# Patient Record
Sex: Female | Born: 1965
Health system: Southern US, Community
[De-identification: ages and names within clinical notes are randomized; demographics above are authoritative.]

## PROBLEM LIST (undated history)

## (undated) DIAGNOSIS — F419 Anxiety disorder, unspecified: Secondary | ICD-10-CM

## (undated) DIAGNOSIS — I10 Essential (primary) hypertension: Secondary | ICD-10-CM

## (undated) DIAGNOSIS — E669 Obesity, unspecified: Secondary | ICD-10-CM

## (undated) NOTE — *Deleted (*Deleted)
Patient reports SOB with dizziness/lighheaded this week , no cough or fever .

---

## 2010-06-15 ENCOUNTER — Emergency Department (HOSPITAL_COMMUNITY)
Admission: EM | Admit: 2010-06-15 | Discharge: 2010-06-16 | Disposition: A | Discharge status: Home / Self Care | Payer: Self-pay | Attending: Emergency Medicine | Admitting: Emergency Medicine

## 2010-06-15 DIAGNOSIS — F39 Unspecified mood [affective] disorder: Secondary | ICD-10-CM

## 2010-06-15 DIAGNOSIS — F3289 Other specified depressive episodes: Secondary | ICD-10-CM | POA: Insufficient documentation

## 2010-06-15 DIAGNOSIS — F430 Acute stress reaction: Secondary | ICD-10-CM | POA: Insufficient documentation

## 2010-06-15 DIAGNOSIS — F29 Unspecified psychosis not due to a substance or known physiological condition: Secondary | ICD-10-CM | POA: Insufficient documentation

## 2010-06-15 DIAGNOSIS — F329 Major depressive disorder, single episode, unspecified: Secondary | ICD-10-CM | POA: Insufficient documentation

## 2010-06-15 LAB — DIFFERENTIAL
Basophils Absolute: 0 10*3/uL (ref 0.0–0.1)
Basophils Relative: 0 % (ref 0–1)
Eosinophils Absolute: 0.1 10*3/uL (ref 0.0–0.7)
Eosinophils Relative: 1 % (ref 0–5)
Lymphocytes Relative: 27 % (ref 12–46)
Lymphs Abs: 2 10*3/uL (ref 0.7–4.0)
Monocytes Absolute: 0.4 10*3/uL (ref 0.1–1.0)
Monocytes Relative: 5 % (ref 3–12)
Neutro Abs: 5 10*3/uL (ref 1.7–7.7)
Neutrophils Relative %: 67 % (ref 43–77)

## 2010-06-15 LAB — URINALYSIS, ROUTINE W REFLEX MICROSCOPIC
Bilirubin Urine: NEGATIVE
Nitrite: NEGATIVE
Specific Gravity, Urine: 1.028 (ref 1.005–1.030)
Urobilinogen, UA: 1 mg/dL (ref 0.0–1.0)
pH: 6 (ref 5.0–8.0)

## 2010-06-15 LAB — URINE MICROSCOPIC-ADD ON

## 2010-06-15 LAB — COMPREHENSIVE METABOLIC PANEL
Albumin: 3.6 g/dL (ref 3.5–5.2)
Alkaline Phosphatase: 56 U/L (ref 39–117)
BUN: 7 mg/dL (ref 6–23)
Calcium: 9.1 mg/dL (ref 8.4–10.5)
Potassium: 3.9 mEq/L (ref 3.5–5.1)
Sodium: 143 mEq/L (ref 135–145)
Total Protein: 7.1 g/dL (ref 6.0–8.3)

## 2010-06-15 LAB — CBC
MCHC: 33.1 g/dL (ref 30.0–36.0)
Platelets: 277 10*3/uL (ref 150–400)
RDW: 13.4 % (ref 11.5–15.5)
WBC: 7.4 10*3/uL (ref 4.0–10.5)

## 2010-06-15 LAB — RAPID URINE DRUG SCREEN, HOSP PERFORMED
Amphetamines: NOT DETECTED
Barbiturates: NOT DETECTED
Benzodiazepines: NOT DETECTED
Cocaine: NOT DETECTED
Opiates: NOT DETECTED
Tetrahydrocannabinol: NOT DETECTED

## 2010-06-15 LAB — PREGNANCY, URINE: Preg Test, Ur: NEGATIVE

## 2010-06-16 ENCOUNTER — Emergency Department (HOSPITAL_COMMUNITY)
Admission: EM | Admit: 2010-06-16 | Discharge: 2010-06-16 | Disposition: A | Discharge status: Home / Self Care | Payer: Self-pay | Attending: Emergency Medicine | Admitting: Emergency Medicine

## 2010-06-16 DIAGNOSIS — F430 Acute stress reaction: Secondary | ICD-10-CM | POA: Insufficient documentation

## 2013-05-15 DIAGNOSIS — I10 Essential (primary) hypertension: Secondary | ICD-10-CM

## 2013-05-15 HISTORY — DX: Essential (primary) hypertension: I10

## 2013-05-30 ENCOUNTER — Emergency Department (HOSPITAL_COMMUNITY)
Admission: EM | Admit: 2013-05-30 | Discharge: 2013-05-30 | Disposition: A | Discharge status: Home / Self Care | Payer: Self-pay | Attending: Emergency Medicine | Admitting: Emergency Medicine

## 2013-05-30 ENCOUNTER — Encounter (HOSPITAL_COMMUNITY): Payer: Self-pay | Admitting: Emergency Medicine

## 2013-05-30 DIAGNOSIS — F4389 Other reactions to severe stress: Secondary | ICD-10-CM | POA: Insufficient documentation

## 2013-05-30 DIAGNOSIS — Z5900 Homelessness unspecified: Secondary | ICD-10-CM | POA: Insufficient documentation

## 2013-05-30 DIAGNOSIS — F438 Other reactions to severe stress: Secondary | ICD-10-CM | POA: Insufficient documentation

## 2013-05-30 DIAGNOSIS — Z659 Problem related to unspecified psychosocial circumstances: Secondary | ICD-10-CM

## 2013-05-30 HISTORY — DX: Anxiety disorder, unspecified: F41.9

## 2013-05-30 NOTE — ED Notes (Signed)
Pt reports she has had lots of family stress recently. She said shes been having severe anxiety attacks. Her sister told her she needs to come to ER to be checked out before she can come back to live with her. Denies any physical complaints now. No SI or HI. Denies any street drug or alcohol use

## 2013-05-30 NOTE — ED Notes (Signed)
Given sandwich and beverage. Waiting for Social Work to arrange transportation to [facility].

## 2013-05-30 NOTE — ED Provider Notes (Signed)
Medical screening examination/treatment/procedure(s) were performed by non-physician practitioner and as supervising physician I was immediately available for consultation/collaboration.  EKG Interpretation   None         Ephraim Hamburger, MD 05/30/13 2231

## 2013-05-30 NOTE — ED Provider Notes (Signed)
CSN: [id number]     Arrival date & time 05/30/13  1009 History  This chart was scribed for non-physician practitioner, Clayton Bibles, PA-C, working with Ephraim Hamburger, MD by Roe Coombs, ED Scribe. This patient was seen in room TR08C/TR08C and the patient's care was started at 11:00 AM.     Chief Complaint  Patient presents with  . Anxiety   The history is provided by the patient. No language interpreter was used.    HPI Comments: Emma Shepherd is a 48 y.o. female who presents to the Emergency Department complaining of multiple anxiety attacks over the past several days. Patient has been living with her sister since 04/06/13. She has been fighting a lot with her sister and has been having increased stress. She states that her sister is very confrontational and has called the police about her several times. Patient reports that she tries to avoid confrontation with her sister, but she feels that her sister exacerbates situation. She further reports that her sister wanted her to come to the ED for evaluation before she can return back home. She says that her sister kicked her out yesterday afternoon and she actually stayed in the waiting room of the ED last night given the extremely cold temperatures outside and nowhere for her to stay. Patient feels that her anxiety is high because she does not see a resolution for her problems with her sister. She tried to call her sister earlier today to try to resolve their interpersonal issue, but her sister was not amenable. She denies depression. She denies HI and SI. She denies visual or auditory hallucinations. She denies fever, shortness of breath, leg swelling, cough, chest pain, abdominal pain, nausea, vomiting. She takes Aleve as needed for neck and back pain. Patient does not smoke.   Past Medical History  Diagnosis Date  . Anxiety    History reviewed. No pertinent past surgical history. History reviewed. No pertinent family history. History   Substance Use Topics  . Smoking status: Never Smoker   . Smokeless tobacco: Not on file  . Alcohol Use: Yes     Comment: social   OB History   Grav Para Term Preterm Abortions TAB SAB Ect Mult Living                 Review of Systems  Constitutional: Negative for fever.  Respiratory: Negative for cough and shortness of breath.   Cardiovascular: Negative for chest pain and leg swelling.  Gastrointestinal: Negative for nausea, vomiting and abdominal pain.  Psychiatric/Behavioral: Negative for suicidal ideas and hallucinations. The patient is nervous/anxious.     Allergies  Review of patient's allergies indicates no known allergies.  Home Medications  No current outpatient prescriptions on file. Triage Vitals: BP 149/115  Pulse 72  Temp(Src) 97.9 F (36.6 C) (Oral)  Resp 18  Wt 259 lb 4 oz (117.595 kg)  SpO2 99% Physical Exam  Nursing note and vitals reviewed. Constitutional: She appears well-developed and well-nourished. No distress.  HENT:  Head: Normocephalic and atraumatic.  Neck: Neck supple.  Cardiovascular: Normal rate and regular rhythm.   Pulmonary/Chest: Effort normal and breath sounds normal. No respiratory distress. She has no wheezes. She has no rales.  Abdominal: Soft. She exhibits no distension. There is no tenderness. There is no rebound and no guarding.  Neurological: She is alert.  Skin: She is not diaphoretic.  Psychiatric: She has a normal mood and affect. Her behavior is normal. Judgment and thought content normal.  ED Course  Procedures (including critical care time) DIAGNOSTIC STUDIES: Oxygen Saturation is 99% on room air, normal by my interpretation.    COORDINATION OF CARE: 11:07 AM- Patient informed of current plan for treatment and evaluation and agrees with plan at this time.     Labs Review Labs Reviewed - No data to display Imaging Review No results found.  EKG Interpretation   None      11:25 AM I spoke with social  worker who will come to see the patient.   MDM   1. Other social stressor     Patient presenting with family and interpersonal issues and nowhere to stay currently. Patient does admit to anxiety but notes that is a situational. She has not had any physical symptoms and denies depression, suicidal ideation, homicidal ideation. I do not believe she needs psychiatric help at this time or medical clearance for inpatient psychiatric care. Pt also believes she has no medical needs at this time. Erasmo Downer, Education officer, museum, spoke with patient and gave her resources.  Patient found a place to stay at a homeless shelter and was given a cab voucher to get there.  D/C.  Resources given.    I personally performed the services described in this documentation, which was scribed in my presence. The recorded information has been reviewed and is accurate.   Clayton Bibles, PA-C 05/30/13 1355

## 2013-05-30 NOTE — ED Notes (Signed)
Pt states she lives with her sister who called the police to pt removed from the house. They have apparently had a very bad relationship. States her sister says she is "disrupting the household". Sister wants pt to bring home a note to say she is "cleared" from a psychiatric standpoint.

## 2013-05-30 NOTE — Progress Notes (Signed)
CSW received referral for housing and mental health resources. CSW met with patient and explained role, patient agreeable to speak. Patient is currently homeless and has been having relationship issues with her sister Wonda Olds and mother. Patient's sister recently kicked patient out of the house, and patient was recently asked to leave [employer]. Patient discussed her relationship issues with her sister. CSW provided patient with emotional support and validated her concerns. CSW provided patient with resource packet including information on emergency housing, food banks, and mental health treatment. Patient requested that CSW contact sister Wonda Olds on her behalf. CSW was agreeable and contacted sister. Sister Wonda Olds provided CSW information regarding concerns for patient's "unrational" behavior and mental health stability. CSW validated sister's concern and attempts to assist patient in the past.   CSW informed patient that she could not return home with her sister at this time. Patient appeared disappointed, but shared that [employer] in [place] had availability. CSW contacted [facility] to get approval for cab voucher, as shelter staff could not pick patient up from bus stop.  Cab voucher provided, [employer] called at 1:45 pm. Patient thanked CSW for assistance.   CSW signing off. Please re-consult if further social work needs arise.  Tilden Fossa, MSW, [place] Clinical Social Worker [facility] Emergency Dept. [phone]

## 2013-05-30 NOTE — ED Notes (Signed)
Social worker paged  

## 2013-05-30 NOTE — Discharge Instructions (Signed)
Read the information below.  You may return to the Emergency Department at any time for worsening condition or any new symptoms that concern you.         Emergency Department Resource Guide 1) Find a Doctor and Pay Out of Pocket Although you won't have to find out who is covered by your insurance plan, it is a good idea to ask around and get recommendations. You will then need to call the office and see if the doctor you have chosen will accept you as a new patient and what types of options they offer for patients who are self-pay. Some doctors offer discounts or will set up payment plans for their patients who do not have insurance, but you will need to ask so you aren't surprised when you get to your appointment.  2) Contact Your Local Health Department Not all health departments have doctors that can see patients for sick visits, but many do, so it is worth a call to see if yours does. If you don't know where your local health department is, you can check in your phone book. The CDC also has a tool to help you locate your state's health department, and many state websites also have listings of all of their local health departments.  3) Find a [facility] If your illness is not likely to be very severe or complicated, you may want to try a walk in clinic. These are popping up all over the country in pharmacies, drugstores, and shopping centers. They're usually staffed by nurse practitioners or physician assistants that have been trained to treat common illnesses and complaints. They're usually fairly quick and inexpensive. However, if you have serious medical issues or chronic medical problems, these are probably not your best option.  No Primary Care Doctor: - Call Health Connect at  [phone] - they can help you locate a primary care doctor that  accepts your insurance, provides certain services, etc. - Physician Referral Service- [phone]  Chronic Pain Problems: Organization          Address  Phone   Notes  [facility]  [phone] Patients need to be referred by their primary care doctor.   Medication Assistance: Organization         Address  Phone   Notes  [facility] Medication [facility] [address] [place], [place] [address] [phone] --Must be a resident of [facility] -- Must have NO insurance coverage whatsoever (no Medicaid/ Medicare, etc.) -- The pt. MUST have a primary care doctor that directs their care regularly and follows them in the community   MedAssist  [phone]   [employer]  9294450034    Agencies that provide inexpensive medical care: Organization         Address  Phone   Notes  [place]  [phone]   [facility]    [phone]   [facility] [place],  [phone]   [place] [address], [place] [phone]   Planned Parenthood    [phone]   [facility]    [phone]   [place] and [place] [address], [place] Phone:  [phone], Fax:  [phone] Hours of Operation:  9 am - 6 pm, M-F.  Also accepts Medicaid/Medicare and self-pay.  [facility]  [address], [place] Phone: [phone], Fax: [phone]. Hours of Operation:  8:30 am - 5:30 pm, M-F.  Also accepts Medicaid and self-pay.  [facility] [address], [place] Phone: [phone]   [place], [place], [place] [phone], Ext. [phone] Mondays & Thursdays: 7-9 AM.  First 15 patients are seen on a first come, first serve basis.    [place] Providers:  Organization         Address  Phone   Notes  [facility] [address], [place] [phone] Also accepts self-pay patients.  [facility] [id number] [place], [place]  [phone]   [place], [address], [place] [phone]   [facility] Family Medicine [address] [place] [phone]   Lucianne Lei [address], [place]   [phone] Only accepts [place] Access [place] patients after they have their name applied to their card.   Self-Pay (no insurance) in [facility]:  Organization         Address  Phone   Notes  Sickle Cell Patients, [facility] Internal Medicine [place] [phone]   [facility] Urgent Care [place] [phone]   [facility] [place]  [place], [place], [place] [phone]   [facility]/Dr. Osei-Bonsu  [address] [place] or [address], [place] [phone] Phone number for both [place] Strite and [place] locations is the same.  [facility] [address] [place] [phone]   [facility] [address], [place] or [address] [phone]   [facility] [address], [place] [phone], phone; [phone], fax Sees patients 1st and 3rd Saturday of every month.  Must not qualify for public or private insurance (i.e. Medicaid, Medicare, [place] Health Choice, Veterans' Benefits)  Household income should be no more than 200% of the poverty level The clinic cannot treat you if you are pregnant or think you are pregnant  Sexually transmitted diseases are not treated at the clinic.    Dental Care: Organization         Address  Phone  Notes  [facility] Department of [facility] [place] [phone] Accepts children up to age 34 who are enrolled in [place] or [place]; pregnant women with a Medicaid card; and  children who have applied for Medicaid or [place] Health Choice, but were declined, whose parents can pay a reduced fee at time of service.  [facility] Department of [facility]  [address], [place] [phone] Accepts children up to age 36 who are enrolled in [place] or [place]; pregnant women with a Medicaid card; and children who have applied for Medicaid or [place] Health Choice, but were declined, whose parents can pay a reduced fee at time of service.  [place] Adult Dental Access PROGRAM  [place] [phone] Patients are seen by appointment only. Walk-ins are not accepted. [place] will see patients 57 years of age and older. Monday - Tuesday (8am-5pm) Most Wednesdays (8:30-5pm) $30 per visit, cash only  [place]  Access PROGRAM  [address], [facility] [phone] Patients are seen by appointment only. Walk-ins are not accepted. [place] will see patients 55 years of age and older. One Wednesday Evening (Monthly: Volunteer Based).  $30 per visit, cash only  [place]  [phone] for adults; Children under age 50, call Graduate Pediatric Dentistry at [phone]. Children aged 59-14, please call [phone] to request a pediatric application.  Dental services are provided in all areas of dental care including fillings, crowns and bridges, complete and partial dentures, implants, gum treatment, root canals, and extractions. Preventive care is also provided. Treatment is provided to both adults and children. Patients are selected via a lottery and there is often a waiting list.   [facility] [address] [place]  [phone] [email or web address]   [facility] [address] [place], [place] [phone], Ext. [phone] Second and Fourth Thursday of each month, opens at 6:30 AM; Clinic ends at 9 AM.  Patients are seen on a first-come first-served  basis, and a limited number are seen during each clinic.   [facility]  [address] Hillard Danker [place], [place] [phone]   Eligibility Requirements You must have lived in [place], [place], or [place] counties for at least the last three months.   You cannot be eligible for state or federal sponsored [employer], including [employer], [place], or [employer].   You generally cannot be eligible for healthcare insurance through your employer.    How to apply: Eligibility screenings are held every Tuesday and Wednesday afternoon from 1:00 pm until 4:00 pm. You do not need an appointment for the interview!  [facility] [address], [place], [place]   [place] Department  [place]  [phone]    Behavioral Health Resources in the Community: Intensive Outpatient Programs Organization         Address  Phone  Notes  Sully Kearns. [address], [place], [place] [phone]   [facility] Outpatient [address], [place], [place]   ADS: Alcohol & Drug Svcs [address] [place], [place]   [place] [address],  [place], [place] or [phone]   Substance Abuse Resources Organization         Address  Phone  Notes  Alcohol and Drug Services  [phone]   [place]  [phone]   [facility] [place]   Chinita Pester  [phone]   Residential & Outpatient Substance Abuse Program  [phone]   Psychological Services Organization         Address  Phone  Notes  [facility] [place]  [place]  [phone]   [place] [address], [place] [phone] or [phone]    Mobile Crisis Teams Organization          Address  Phone  Notes  Therapeutic Alternatives, Mobile Crisis Care Unit  [phone]   Assertive Psychotherapeutic Services  [address] [place], [place]   [place] Levels [address] [place] [phone]    Self-Help/Support Groups Organization         Address  Phone             Notes  Mental Health  [place] - variety of support groups  336- H3156881 Call for more information  Narcotics Anonymous (NA), Caring Services [address], [employer] [place]  2 meetings at this location   Special educational needs teacher         Address  Phone  Notes  ASAP Residential Treatment [place],    [place]  [phone]   [facility]  [address], [place] [id number], [place], [place]   [place], [place] [phone] Admissions: 8am-3pm M-F  Incentives Substance [place] [address],    [place], [place] [phone]   [facility] [address]  [place], [place], [place]   [facility] [address],  [place], [place]   Insight Programs - Intensive Outpatient [address] Krist[address], [place], [place]   [facility] ([place].) [place].,  [place], [place] [phone]) [phone] or [phone]   Residential Treatment Services (RTS) [address], [place], [place] Accepts Medicaid  Fellowship [place] [address]  [place] [phone] Substance Abuse/Addiction Treatment   [facility] Organization         Address  Phone  Notes  [facility]  [phone]   Domenic Schwab, PhD [address] Arlis Porta [place], [place]   [phone] or [phone]   [place], [place] [phone]   [facility] [address], [place], [place] [phone] Insurance/Medicaid/sponsorship  through [facility] and Families [address] [place]                                    [place], [place] [phone] [place] [address], [place] [phone]    Dr. Adele Schilder  [phone]   [facility] Dept. 1) [address], [place] 2) [place] 3)  [place] [address], [place] [phone]  [phone]   [place] [phone] or [phone] (After Hours)      [place] in the  [facility]  Intensive Outpatient Programs: [facility]      Silvana. [place], [place] Both a day and evening program       [facility] Outpatient     [address]        [place], [place] [address] [phone]         ADS: Alcohol & Drug Svcs [place]: ([phone])[phone] or 8313505232 [address] [place], [place] [address] [email or web address]  Mobile Crisis Teams:                                        Therapeutic Alternatives         Mobile Crisis Care Unit [phone]             Assertive Psychotherapeutic Services [address] Lady [place] [phone]  [place] [address] [place] [phone]  Self-Help/Support Groups: Mental Health Assoc. of [employer] of support groups ([phone])[phone] (call for more info)  Narcotics Anonymous (NA) Caring Services [address] [place] - 2 meetings at this location  Residential Treatment Programs:  [place]       [place] [address] [place], [place]   [address] [place]  [address] [place], [place] [address] [phone] Admissions: 8am-3pm M-F  Incentives Substance [place]     [address] [place], [place] [address]       [phone]         [facility] [address] Jadene Pierini [place], [place]  [facility] [address] [place], [place]  Insight Programs - Intensive Outpatient      [address] [place] [phone]     [place], [place]         [facility] ([place].)     [place], [place] or [phone]  Residential Treatment Services (RTS)  [place], [place]  Fellowship [address]                                               [place]  [facility] Resources: [phone]               General Therapy                                                Domenic Schwab, PhD        [address] [place], [place] [address]         [facility]   [address] [place], [place] [address] [phone]  [facility] Recovery [address] [place], [place] [address] [phone] Insurance/Medicaid/sponsorship through [facility] and Families                                              [place]                                        [place], [place] [address]    Therapy/tele-psych/case         [place]  [place], [place]  [address]  Adolescent/group home/case management  [place] PhD       General therapy       Insurance   [phone]         Dr. Adele Schilder Insurance [phone] M-F  [place] [facility]ox/Residential Medicaid, sponsorship [phone]

## 2013-07-22 ENCOUNTER — Encounter (HOSPITAL_COMMUNITY): Payer: Self-pay | Admitting: Emergency Medicine

## 2013-07-22 ENCOUNTER — Emergency Department (HOSPITAL_COMMUNITY)
Admission: EM | Admit: 2013-07-22 | Discharge: 2013-07-22 | Disposition: A | Discharge status: Home / Self Care | Payer: Self-pay | Attending: Emergency Medicine | Admitting: Emergency Medicine

## 2013-07-22 DIAGNOSIS — Z3202 Encounter for pregnancy test, result negative: Secondary | ICD-10-CM | POA: Insufficient documentation

## 2013-07-22 DIAGNOSIS — Z8659 Personal history of other mental and behavioral disorders: Secondary | ICD-10-CM | POA: Insufficient documentation

## 2013-07-22 DIAGNOSIS — N760 Acute vaginitis: Secondary | ICD-10-CM | POA: Insufficient documentation

## 2013-07-22 DIAGNOSIS — A499 Bacterial infection, unspecified: Secondary | ICD-10-CM | POA: Insufficient documentation

## 2013-07-22 DIAGNOSIS — B9689 Other specified bacterial agents as the cause of diseases classified elsewhere: Secondary | ICD-10-CM | POA: Insufficient documentation

## 2013-07-22 LAB — COMPREHENSIVE METABOLIC PANEL
ALT: 12 U/L (ref 0–35)
AST: 15 U/L (ref 0–37)
Albumin: 3.6 g/dL (ref 3.5–5.2)
Alkaline Phosphatase: 69 U/L (ref 39–117)
BUN: 10 mg/dL (ref 6–23)
CALCIUM: 9.4 mg/dL (ref 8.4–10.5)
CO2: 29 mEq/L (ref 19–32)
Chloride: 101 mEq/L (ref 96–112)
Creatinine, Ser: 0.75 mg/dL (ref 0.50–1.10)
GFR calc non Af Amer: >90 mL/min (ref >90)
GLUCOSE: 84 mg/dL (ref 70–99)
Potassium: 4 mEq/L (ref 3.7–5.3)
SODIUM: 140 meq/L (ref 137–147)
TOTAL PROTEIN: 8 g/dL (ref 6.0–8.3)
Total Bilirubin: 0.4 mg/dL (ref 0.3–1.2)

## 2013-07-22 LAB — URINALYSIS, ROUTINE W REFLEX MICROSCOPIC
BILIRUBIN URINE: NEGATIVE
Glucose, UA: NEGATIVE mg/dL
Hgb urine dipstick: NEGATIVE
Ketones, ur: NEGATIVE mg/dL
Leukocytes, UA: NEGATIVE
NITRITE: NEGATIVE
PH: 7.5 (ref 5.0–8.0)
Protein, ur: NEGATIVE mg/dL
SPECIFIC GRAVITY, URINE: 1.022 (ref 1.005–1.030)
Urobilinogen, UA: 0.2 mg/dL (ref 0.0–1.0)

## 2013-07-22 LAB — CBC WITH DIFFERENTIAL/PLATELET
BASOS ABS: 0 10*3/uL (ref 0.0–0.1)
Basophils Relative: 0 % (ref 0–1)
EOS ABS: 0.1 10*3/uL (ref 0.0–0.7)
EOS PCT: 2 % (ref 0–5)
HCT: 42.8 % (ref 36.0–46.0)
Hemoglobin: 14.5 g/dL (ref 12.0–15.0)
Lymphocytes Relative: 25 % (ref 12–46)
Lymphs Abs: 2.2 10*3/uL (ref 0.7–4.0)
MCH: 28.5 pg (ref 26.0–34.0)
MCHC: 33.9 g/dL (ref 30.0–36.0)
MCV: 84.3 fL (ref 78.0–100.0)
Monocytes Absolute: 0.4 10*3/uL (ref 0.1–1.0)
Monocytes Relative: 4 % (ref 3–12)
Neutro Abs: 6 10*3/uL (ref 1.7–7.7)
Neutrophils Relative %: 69 % (ref 43–77)
PLATELETS: 288 10*3/uL (ref 150–400)
RBC: 5.08 MIL/uL (ref 3.87–5.11)
RDW: 14.3 % (ref 11.5–15.5)
WBC: 8.7 10*3/uL (ref 4.0–10.5)

## 2013-07-22 LAB — WET PREP, GENITAL
TRICH WET PREP: NONE SEEN
YEAST WET PREP: NONE SEEN

## 2013-07-22 LAB — POC URINE PREG, ED: PREG TEST UR: NEGATIVE

## 2013-07-22 MED ORDER — METRONIDAZOLE 500 MG PO TABS: 500.0000 mg | ORAL_TABLET | Freq: Two times a day (BID) | ORAL | Status: DC

## 2013-07-22 NOTE — ED Notes (Signed)
Pt reports RLQ that has been intermittent about the last month. Pt reports last night the pain became more intense. Pt denies nausea, vomiting. Reports recent diarrhea.

## 2013-07-22 NOTE — ED Provider Notes (Signed)
CSN: [id number]     Arrival date & time 07/22/13  1143 History   First MD Initiated Contact with Patient 07/22/13 1505     Chief Complaint  Patient presents with  . Abdominal Pain     (Consider location/radiation/quality/duration/timing/severity/associated sxs/prior Treatment) HPI Comments: Patient is a 48 year old female with history of anxiety who presents today with 1 month of intermittent abdominal pain. She reports that the pain is only present at night and it is more of a pulling sensation. She reports it was the same sensation she had when she was pregnant. The pain seems to have worsened over the past 5 days, but is consistently only at night. Certain positions make the pain worse. She has not taken any medication for the pain because it does not bother her during the day. She has had a new boyfriend for the past 3 months and reports an increase in sexual intercourse. She denies severe dyspareunia, stating "if there is any pain we will change position and it's ok". She denies fevers, chills, nausea, vomiting, diarrhea, vaginal discharge, abnormal vaginal bleeding, appetite change.   Patient is a 48 y.o. female presenting with abdominal pain. The history is provided by the patient. No language interpreter was used.  Abdominal Pain Associated symptoms: no chest pain, no chills, no diarrhea, no dysuria, no fever, no hematuria, no nausea, no shortness of breath, no vaginal bleeding, no vaginal discharge and no vomiting     Past Medical History  Diagnosis Date  . Anxiety    History reviewed. No pertinent past surgical history. History reviewed. No pertinent family history. History  Substance Use Topics  . Smoking status: Never Smoker   . Smokeless tobacco: Not on file  . Alcohol Use: Yes     Comment: social   OB History   Grav Para Term Preterm Abortions TAB SAB Ect Mult Living                 Review of Systems  Constitutional: Negative for fever, chills and appetite change.   Respiratory: Negative for shortness of breath.   Cardiovascular: Negative for chest pain.  Gastrointestinal: Positive for abdominal pain. Negative for nausea, vomiting and diarrhea.  Genitourinary: Positive for pelvic pain. Negative for dysuria, urgency, hematuria, flank pain, vaginal bleeding, vaginal discharge and menstrual problem.  All other systems reviewed and are negative.      Allergies  Review of patient's allergies indicates no known allergies.  Home Medications  No current outpatient prescriptions on file. BP 145/62  Pulse 67  Temp(Src) 98.3 F (36.8 C) (Oral)  Resp 20  Wt 253 lb 4.8 oz (114.896 kg)  SpO2 100%  LMP 07/13/2013 Physical Exam  Nursing note and vitals reviewed. Constitutional: She is oriented to person, place, and time. She appears well-developed and well-nourished.  Non-toxic appearance. She does not have a sickly appearance. She does not appear ill. No distress.  Very well appearing. Patient appears very comfortable.   HENT:  Head: Normocephalic and atraumatic.  Right Ear: External ear normal.  Left Ear: External ear normal.  Nose: Nose normal.  Mouth/Throat: Oropharynx is clear and moist.  Eyes: Conjunctivae are normal.  Neck: Normal range of motion.  Cardiovascular: Normal rate, regular rhythm and normal heart sounds.   Pulmonary/Chest: Effort normal and breath sounds normal. No stridor. No respiratory distress. She has no wheezes. She has no rales.  Abdominal: Soft. Bowel sounds are normal. She exhibits no distension. There is no tenderness. There is no rigidity, no  rebound and no guarding.  Genitourinary: Vagina normal. There is no rash, tenderness, lesion or injury on the right labia. There is no rash, tenderness, lesion or injury on the left labia. Cervix exhibits no motion tenderness, no discharge and no friability. Right adnexum displays no mass, no tenderness and no fullness. Left adnexum displays no mass, no tenderness and no fullness. No  erythema, tenderness or bleeding around the vagina. No foreign body around the vagina. No signs of injury around the vagina. No vaginal discharge found.  No tenderness to exam. No cervical irritation seen.   Musculoskeletal: Normal range of motion.  Neurological: She is alert and oriented to person, place, and time. She has normal strength.  Skin: Skin is warm and dry. She is not diaphoretic. No erythema.  Psychiatric: She has a normal mood and affect. Her behavior is normal.    ED Course  Procedures (including critical care time) Labs Review Labs Reviewed  WET PREP, GENITAL - Abnormal; Notable for the following:    Clue Cells Wet Prep HPF POC FEW (*)    WBC, Wet Prep HPF POC FEW (*)    All other components within normal limits  GC/CHLAMYDIA PROBE AMP  CBC WITH DIFFERENTIAL  COMPREHENSIVE METABOLIC PANEL  URINALYSIS, ROUTINE W REFLEX MICROSCOPIC  RPR  POC URINE PREG, ED   Imaging Review No results found.   EKG Interpretation None      MDM   Final diagnoses:  Bacterial vaginosis   Patient presents to the ED with complaint of intermittent pulling in her RLQ at night for the past month. Patient is very well appearing. She is energetic and talkative. She has had a significant increase in sexual intercourse and is concerned this is the route of her problem. Her abdomen is soft and nontender throughout her ED stay. Pelvic exam shows no tenderness, specifically no CMT. No concern for PID, ovarian torsion, ectopic pregnancy, or appendicitis. Few clue cells were seen on wet prep. Will tx for BV with flagyl. Discussed that patient should avoid drinking alcohol while on flagyl. She was encouraged to follow up with PCP as well as a gynecologist. Discussed reasons to the return to the ED. Vital signs stable for discharge. Patient / Family / Caregiver informed of clinical course, understand medical decision-making process, and agree with plan.   Elwyn Lade, PA-C 07/23/13 1344

## 2013-07-22 NOTE — ED Notes (Signed)
Onset 1 month ago intermittant pain right of lower mid abd pain.  Pain is worse at night when pt lays down, unable to get comfortable.  Not much pain during day.  No vomiting, diarrhea, fevers, urinary symptoms.

## 2013-07-22 NOTE — Discharge Instructions (Signed)

## 2013-07-22 NOTE — ED Notes (Signed)
Called lab to check on wet prep results.  Per lab she will do test now. PA aware.

## 2013-07-23 LAB — GC/CHLAMYDIA PROBE AMP
CT PROBE, AMP APTIMA: NEGATIVE
GC Probe RNA: NEGATIVE

## 2013-07-23 LAB — RPR: RPR Ser Ql: NONREACTIVE

## 2013-07-26 NOTE — ED Provider Notes (Signed)
Medical screening examination/treatment/procedure(s) were performed by non-physician practitioner and as supervising physician I was immediately available for consultation/collaboration.    Kathalene Frames, MD 07/26/13 [id number]

## 2013-08-05 ENCOUNTER — Emergency Department (HOSPITAL_COMMUNITY)
Admission: EM | Admit: 2013-08-05 | Discharge: 2013-08-05 | Disposition: A | Discharge status: Home / Self Care | Payer: Self-pay | Attending: Emergency Medicine | Admitting: Emergency Medicine

## 2013-08-05 ENCOUNTER — Encounter (HOSPITAL_COMMUNITY): Payer: Self-pay | Admitting: Emergency Medicine

## 2013-08-05 DIAGNOSIS — N949 Unspecified condition associated with female genital organs and menstrual cycle: Secondary | ICD-10-CM | POA: Insufficient documentation

## 2013-08-05 DIAGNOSIS — R102 Pelvic and perineal pain: Secondary | ICD-10-CM

## 2013-08-05 DIAGNOSIS — Z3202 Encounter for pregnancy test, result negative: Secondary | ICD-10-CM | POA: Insufficient documentation

## 2013-08-05 DIAGNOSIS — Z79899 Other long term (current) drug therapy: Secondary | ICD-10-CM | POA: Insufficient documentation

## 2013-08-05 DIAGNOSIS — Z8659 Personal history of other mental and behavioral disorders: Secondary | ICD-10-CM | POA: Insufficient documentation

## 2013-08-05 LAB — URINALYSIS, ROUTINE W REFLEX MICROSCOPIC
Bilirubin Urine: NEGATIVE
GLUCOSE, UA: NEGATIVE mg/dL
Hgb urine dipstick: NEGATIVE
Ketones, ur: NEGATIVE mg/dL
LEUKOCYTES UA: NEGATIVE
Nitrite: NEGATIVE
PH: 6 (ref 5.0–8.0)
Protein, ur: NEGATIVE mg/dL
SPECIFIC GRAVITY, URINE: 1.025 (ref 1.005–1.030)
Urobilinogen, UA: 0.2 mg/dL (ref 0.0–1.0)

## 2013-08-05 LAB — POC URINE PREG, ED: Preg Test, Ur: NEGATIVE

## 2013-08-05 LAB — WET PREP, GENITAL
CLUE CELLS WET PREP: NONE SEEN
TRICH WET PREP: NONE SEEN
YEAST WET PREP: NONE SEEN

## 2013-08-05 NOTE — ED Provider Notes (Signed)
CSN: [id number]     Arrival date & time 08/05/13  2671 History   First MD Initiated Contact with Patient 08/05/13 [phone]     Chief Complaint  Patient presents with  . Pelvic Pain     (Consider location/radiation/quality/duration/timing/severity/associated sxs/prior Treatment) Patient is a 48 y.o. female presenting with pelvic pain.  Pelvic Pain The current episode started 1 to 4 weeks ago. Pertinent negatives include no chills, fever, nausea or vomiting. Associated symptoms comments: Vaginal pain and suprapubic pressure for the past one month. She was seen and evaluated on 07/22/13 in the ED for same, treated for BV with minimal improvement. She states that symptoms persist, no worse, prompting return to the ED. Marland Kitchen    Past Medical History  Diagnosis Date  . Anxiety    History reviewed. No pertinent past surgical history. History reviewed. No pertinent family history. History  Substance Use Topics  . Smoking status: Never Smoker   . Smokeless tobacco: Not on file  . Alcohol Use: Yes     Comment: social   OB History   Grav Para Term Preterm Abortions TAB SAB Ect Mult Living                 Review of Systems  Constitutional: Negative for fever and chills.  Gastrointestinal: Negative.  Negative for nausea and vomiting.       See HPI.  Genitourinary: Positive for vaginal pain and pelvic pain. Negative for dysuria and vaginal discharge.  Neurological: Negative.       Allergies  Review of patient's allergies indicates no known allergies.  Home Medications   Current Outpatient Rx  Name  Route  Sig  Dispense  Refill  . naproxen sodium (ANAPROX) 220 MG tablet   Oral   Take 440 mg by mouth daily as needed (pain).         . metroNIDAZOLE (FLAGYL) 500 MG tablet   Oral   Take 1 tablet (500 mg total) by mouth 2 (two) times daily. One po bid x 7 days   14 tablet   0    BP 154/79  Pulse 75  Temp(Src) 98.3 F (36.8 C) (Oral)  Resp 18  Wt 254 lb (115.214 kg)  SpO2 98%   LMP 07/13/2013 Physical Exam  Constitutional: She is oriented to person, place, and time. She appears well-developed and well-nourished.  HENT:  Head: Normocephalic.  Neck: Normal range of motion. Neck supple.  Cardiovascular: Normal rate and regular rhythm.   Pulmonary/Chest: Effort normal and breath sounds normal.  Abdominal: Soft. Bowel sounds are normal. There is no tenderness. There is no rebound and no guarding.  Genitourinary: Vagina normal and uterus normal. No vaginal discharge found.  No adnexal mass or tenderness. No blisters, redness or swelling of vulva.  Musculoskeletal: Normal range of motion.  Neurological: She is alert and oriented to person, place, and time.  Skin: Skin is warm and dry.  Psychiatric: She has a normal mood and affect.    ED Course  Procedures (including critical care time) Labs Review Labs Reviewed  WET PREP, GENITAL - Abnormal; Notable for the following:    WBC, Wet Prep HPF POC FEW (*)    All other components within normal limits  GC/CHLAMYDIA PROBE AMP  URINALYSIS, ROUTINE W REFLEX MICROSCOPIC  POC URINE PREG, ED   Results for orders placed during the hospital encounter of 08/05/13  WET PREP, GENITAL      Result Value Ref Range   Yeast Wet Prep HPF POC  NONE SEEN  NONE SEEN   Trich, Wet Prep NONE SEEN  NONE SEEN   Clue Cells Wet Prep HPF POC NONE SEEN  NONE SEEN   WBC, Wet Prep HPF POC FEW (*) NONE SEEN  URINALYSIS, ROUTINE W REFLEX MICROSCOPIC      Result Value Ref Range   Color, Urine YELLOW  YELLOW   APPearance CLEAR  CLEAR   Specific Gravity, Urine 1.025  1.005 - 1.030   pH 6.0  5.0 - 8.0   Glucose, UA NEGATIVE  NEGATIVE mg/dL   Hgb urine dipstick NEGATIVE  NEGATIVE   Bilirubin Urine NEGATIVE  NEGATIVE   Ketones, ur NEGATIVE  NEGATIVE mg/dL   Protein, ur NEGATIVE  NEGATIVE mg/dL   Urobilinogen, UA 0.2  0.0 - 1.0 mg/dL   Nitrite NEGATIVE  NEGATIVE   Leukocytes, UA NEGATIVE  NEGATIVE  POC URINE PREG, ED      Result Value Ref  Range   Preg Test, Ur NEGATIVE  NEGATIVE    Imaging Review No results found.   EKG Interpretation None      MDM   Final diagnoses:  None    1. Vaginal pain  No change to exam and lab findings. GC/chlamydia not repeated. Discussed condition with patient and need for GYN follow up. Referral made.   Dewaine Oats, PA-C 08/05/13 1058

## 2013-08-05 NOTE — ED Notes (Signed)
Pelvic cart to room

## 2013-08-05 NOTE — ED Notes (Signed)
Pelvic cart set-up at bedside; pt ready

## 2013-08-05 NOTE — ED Notes (Signed)
Pt reports being seen here two weeks ago for pelvic pressure, was diagnosed with bacterial infection and treated with antibiotics. Reports still having pelvic pain and now vaginal itching also.

## 2013-08-05 NOTE — ED Notes (Signed)
Pt dc to home. Pt sts understanding to dc instructions. Pt ambulatory to exit without difficulty.  Pt denies need for w/c.  

## 2013-08-05 NOTE — Discharge Instructions (Signed)
FOLLOW UP WITH GYNECOLOGY FOR FURTHER MANAGEMENT AND EVALUATION OF PERSISTENT VAGINAL PAIN AND ABDOMINAL PRESSURE.

## 2013-08-05 NOTE — ED Notes (Signed)
Chaperoned [place], PA with pelvic examination

## 2013-08-06 LAB — GC/CHLAMYDIA PROBE AMP
CT Probe RNA: NEGATIVE
GC Probe RNA: NEGATIVE

## 2013-08-06 NOTE — ED Provider Notes (Signed)
Medical screening examination/treatment/procedure(s) were performed by non-physician practitioner and as supervising physician I was immediately available for consultation/collaboration.   EKG Interpretation None       Nat Christen, MD 08/06/13 1219

## 2013-11-01 ENCOUNTER — Encounter (HOSPITAL_COMMUNITY): Payer: Self-pay | Admitting: Emergency Medicine

## 2013-11-01 ENCOUNTER — Emergency Department (HOSPITAL_COMMUNITY)
Admission: EM | Admit: 2013-11-01 | Discharge: 2013-11-01 | Discharge status: Against Medical Advice | Payer: Self-pay | Attending: Emergency Medicine | Admitting: Emergency Medicine

## 2013-11-01 DIAGNOSIS — R110 Nausea: Secondary | ICD-10-CM | POA: Insufficient documentation

## 2013-11-01 DIAGNOSIS — N898 Other specified noninflammatory disorders of vagina: Secondary | ICD-10-CM | POA: Insufficient documentation

## 2013-11-01 DIAGNOSIS — Z8659 Personal history of other mental and behavioral disorders: Secondary | ICD-10-CM | POA: Insufficient documentation

## 2013-11-01 DIAGNOSIS — R51 Headache: Secondary | ICD-10-CM | POA: Insufficient documentation

## 2013-11-01 DIAGNOSIS — R109 Unspecified abdominal pain: Secondary | ICD-10-CM | POA: Insufficient documentation

## 2013-11-01 DIAGNOSIS — R42 Dizziness and giddiness: Secondary | ICD-10-CM | POA: Insufficient documentation

## 2013-11-01 DIAGNOSIS — Z3202 Encounter for pregnancy test, result negative: Secondary | ICD-10-CM | POA: Insufficient documentation

## 2013-11-01 DIAGNOSIS — N949 Unspecified condition associated with female genital organs and menstrual cycle: Secondary | ICD-10-CM | POA: Insufficient documentation

## 2013-11-01 DIAGNOSIS — R197 Diarrhea, unspecified: Secondary | ICD-10-CM | POA: Insufficient documentation

## 2013-11-01 DIAGNOSIS — Z792 Long term (current) use of antibiotics: Secondary | ICD-10-CM | POA: Insufficient documentation

## 2013-11-01 LAB — URINALYSIS, ROUTINE W REFLEX MICROSCOPIC
Bilirubin Urine: NEGATIVE
GLUCOSE, UA: NEGATIVE mg/dL
KETONES UR: NEGATIVE mg/dL
Nitrite: NEGATIVE
PH: 7 (ref 5.0–8.0)
PROTEIN: NEGATIVE mg/dL
Specific Gravity, Urine: 1.025 (ref 1.005–1.030)
Urobilinogen, UA: 1 mg/dL (ref 0.0–1.0)

## 2013-11-01 LAB — URINE MICROSCOPIC-ADD ON

## 2013-11-01 LAB — PREGNANCY, URINE: Preg Test, Ur: NEGATIVE

## 2013-11-01 MED ORDER — SODIUM CHLORIDE 0.9 % IV BOLUS (SEPSIS): 1000.0000 mL | Freq: Once | INTRAVENOUS | Status: DC

## 2013-11-01 NOTE — ED Notes (Signed)
Pt. Having lower abdominal pain,  Began this am after having sex.  After sex, she also started having her menses only when she goes to the bathroom.  Breast tenderness, loose stool in the mornings for the last 4 weeks.  She thinks she might be pregnant. Pt. Reports that her last full menses was March.  3rd.    She is having abdominal cramping.  Pt. Denies  N/v.  She also is experiencing dizziness when crossing bridges and also crossing the road.

## 2013-11-01 NOTE — ED Notes (Signed)
Pt left AMA °

## 2013-11-01 NOTE — ED Provider Notes (Signed)
Medical screening examination/treatment/procedure(s) were performed by non-physician practitioner and as supervising physician I was immediately available for consultation/collaboration.   EKG Interpretation None        Ezequiel Essex, MD 11/01/13 2010

## 2013-11-01 NOTE — ED Provider Notes (Signed)
CSN: [id number]     Arrival date & time 11/01/13  1444 History   First MD Initiated Contact with Patient 11/01/13 1519     Chief Complaint  Patient presents with  . Abdominal Pain   HPI  Emma Shepherd is a 48 y.o. female with a PMH of anxiety who presents to the ED for evaluation of abdominal pain. History was provided by the patient. Patient states she developed lower abdominal pain this morning after intercourse. She describes a "discomfort" in her lower abdomen throughout with no focal pain. Also had mild vaginal spotting which started after intercourse. No hemorrhage or lacerations. Her pain is intermittent. Nothing makes it better or worse. She states she has had this before but cannot remember the cause. She believes she is pregnant and has not taken a home pregnancy test. She has not had a full menstrual cycle since March. She has had intermittent spotting for a day or two since March, but not "like my regular period." Does not use contraception. Patient has also had breast tenderness and intermittent nausea. No dysuria, vaginal discharge, emesis. Has had loose stools for the past month. No hematochezia. Patient also complains of lightheadedness and dizziness when crossing bridges and the road, but denies these symptoms currently. Has a mild generalized headache on ROS. No fever, chills, weakness, chest pain, SOB, or other concerns.    Past Medical History  Diagnosis Date  . Anxiety    History reviewed. No pertinent past surgical history. No family history on file. History  Substance Use Topics  . Smoking status: Never Smoker   . Smokeless tobacco: Not on file  . Alcohol Use: Yes     Comment: social   OB History   Grav Para Term Preterm Abortions TAB SAB Ect Mult Living                 Review of Systems  Constitutional: Negative for fever, chills, activity change, appetite change and fatigue.  HENT: Negative for congestion, rhinorrhea and sore throat.   Respiratory: Negative  for cough and shortness of breath.   Cardiovascular: Negative for chest pain and leg swelling.  Gastrointestinal: Positive for nausea, abdominal pain and diarrhea. Negative for vomiting, constipation and blood in stool.  Genitourinary: Positive for vaginal bleeding, vaginal pain and menstrual problem. Negative for dysuria, urgency, frequency, hematuria, flank pain, decreased urine volume, vaginal discharge, difficulty urinating, genital sores, pelvic pain and dyspareunia.  Musculoskeletal: Negative for back pain and myalgias.  Skin: Negative for wound.  Neurological: Positive for dizziness, light-headedness and headaches. Negative for weakness.    Allergies  Review of patient's allergies indicates no known allergies.  Home Medications   Prior to Admission medications   Medication Sig Start Date End Date Taking? Authorizing Provider  metroNIDAZOLE (FLAGYL) 500 MG tablet Take 1 tablet (500 mg total) by mouth 2 (two) times daily. One po bid x 7 days 07/22/13   Elwyn Lade, PA-C  naproxen sodium (ANAPROX) 220 MG tablet Take 440 mg by mouth daily as needed (pain).    Historical Provider, MD   BP 147/104  Pulse 108  Temp(Src) 98.6 F (37 C) (Oral)  Resp 18  Ht 5\' 4"  (1.626 m)  Wt 264 lb (119.75 kg)  BMI 45.29 kg/m2  SpO2 98%  LMP 07/28/2013  Filed Vitals:   11/01/13 1501  BP: 147/104  Pulse: 108  Temp: 98.6 F (37 C)  TempSrc: Oral  Resp: 18  Height: 5\' 4"  (1.626 m)  Weight: 264 lb (  119.75 kg)  SpO2: 98%    Physical Exam  Nursing note and vitals reviewed. Constitutional: She is oriented to person, place, and time. She appears well-developed and well-nourished. No distress.  HENT:  Head: Normocephalic and atraumatic.  Right Ear: External ear normal.  Left Ear: External ear normal.  Nose: Nose normal.  Eyes: Conjunctivae are normal. Right eye exhibits no discharge. Left eye exhibits no discharge.  Neck: Normal range of motion. Neck supple.  Cardiovascular: Normal  rate, regular rhythm and normal heart sounds.  Exam reveals no gallop and no friction rub.   No murmur heard. Pulmonary/Chest: Effort normal and breath sounds normal. No respiratory distress. She has no wheezes. She has no rales. She exhibits no tenderness.  Abdominal: Soft. Bowel sounds are normal. She exhibits no distension and no mass. There is no tenderness. There is no rebound and no guarding.  Musculoskeletal: Normal range of motion. She exhibits no edema and no tenderness.  No CVA, lumbar, or flank tenderness bilaterally.   Neurological: She is alert and oriented to person, place, and time.  Skin: Skin is warm and dry. She is not diaphoretic.     ED Course  Procedures (including critical care time) Labs Review Labs Reviewed - No data to display  Imaging Review No results found.   EKG Interpretation None      Results for orders placed during the hospital encounter of 11/01/13  URINALYSIS, ROUTINE W REFLEX MICROSCOPIC      Result Value Ref Range   Color, Urine YELLOW  YELLOW   APPearance HAZY (*) CLEAR   Specific Gravity, Urine 1.025  1.005 - 1.030   pH 7.0  5.0 - 8.0   Glucose, UA NEGATIVE  NEGATIVE mg/dL   Hgb urine dipstick LARGE (*) NEGATIVE   Bilirubin Urine NEGATIVE  NEGATIVE   Ketones, ur NEGATIVE  NEGATIVE mg/dL   Protein, ur NEGATIVE  NEGATIVE mg/dL   Urobilinogen, UA 1.0  0.0 - 1.0 mg/dL   Nitrite NEGATIVE  NEGATIVE   Leukocytes, UA SMALL (*) NEGATIVE  PREGNANCY, URINE      Result Value Ref Range   Preg Test, Ur NEGATIVE  NEGATIVE  URINE MICROSCOPIC-ADD ON      Result Value Ref Range   WBC, UA 0-2  <3 WBC/hpf   RBC / HPF TOO NUMEROUS TO COUNT  <3 RBC/hpf   Urine-Other MUCOUS PRESENT      MDM   Emma Shepherd is a 47 y.o. female with a PMH of anxiety who presents to the ED for evaluation of abdominal pain. Etiology of abdominal pain unclear. Patient refused work-up and signed-out AMA. No peritoneal signs on abdominal exam. Patient afebrile and  non-toxic in appearance.   Rechecks  4:00 PM = Offered patient something for her headache and she refused stating she doesn't like to take medications.  4:20 PM = Inserting IV on patient and obtaining labs (personally) and she became upset and asked me to take the IV out. States she wants to leave AMA after being informed of negative pregnancy. Patient states she wants to follow-up with her primary doctor and has "been to the ED before for this" and doesn't get the care she is looking for and "I don't want more hospital bills." Spoke with patient about staying however she would like to leave AMA. Patient acknowledged risks, got dressed and walked out. Would not wait to sign AMA.    New Prescriptions   No medications on file     Final impressions: 1.  Abdominal pain, unspecified abdominal location      [facility]           Lucila Maine, PA-C 11/01/13 1627

## 2013-12-07 ENCOUNTER — Emergency Department (HOSPITAL_COMMUNITY)
Admission: EM | Admit: 2013-12-07 | Discharge: 2013-12-07 | Discharge status: Against Medical Advice | Payer: Self-pay | Attending: Emergency Medicine | Admitting: Emergency Medicine

## 2013-12-07 ENCOUNTER — Encounter (HOSPITAL_COMMUNITY): Payer: Self-pay | Admitting: Emergency Medicine

## 2013-12-07 DIAGNOSIS — R1031 Right lower quadrant pain: Secondary | ICD-10-CM | POA: Insufficient documentation

## 2013-12-07 DIAGNOSIS — Z3202 Encounter for pregnancy test, result negative: Secondary | ICD-10-CM | POA: Insufficient documentation

## 2013-12-07 DIAGNOSIS — N898 Other specified noninflammatory disorders of vagina: Secondary | ICD-10-CM | POA: Insufficient documentation

## 2013-12-07 LAB — URINALYSIS, ROUTINE W REFLEX MICROSCOPIC
Bilirubin Urine: NEGATIVE
Glucose, UA: NEGATIVE mg/dL
Ketones, ur: NEGATIVE mg/dL
Leukocytes, UA: NEGATIVE
Nitrite: NEGATIVE
PROTEIN: NEGATIVE mg/dL
Specific Gravity, Urine: 1.025 (ref 1.005–1.030)
Urobilinogen, UA: 0.2 mg/dL (ref 0.0–1.0)
pH: 6 (ref 5.0–8.0)

## 2013-12-07 LAB — URINE MICROSCOPIC-ADD ON

## 2013-12-07 LAB — POC URINE PREG, ED: Preg Test, Ur: NEGATIVE

## 2013-12-07 NOTE — ED Notes (Signed)
Unable to locate pt in [facility].

## 2013-12-07 NOTE — ED Notes (Addendum)
Pt reports R lower abd cramping and bright red vaginal bleeding.  LMP was in April.  Was seen last month for same.  Pt is concerned that she could be pregnant.

## 2013-12-07 NOTE — ED Notes (Signed)
PT did no answer when name called in the waiting room; unable to locate pt in waiting room or area immediately outside

## 2014-05-14 ENCOUNTER — Encounter (HOSPITAL_COMMUNITY): Payer: Self-pay | Admitting: Emergency Medicine

## 2014-05-14 ENCOUNTER — Emergency Department (HOSPITAL_COMMUNITY)
Admission: EM | Admit: 2014-05-14 | Discharge: 2014-05-14 | Disposition: A | Discharge status: Home / Self Care | Payer: Self-pay | Attending: Emergency Medicine | Admitting: Emergency Medicine

## 2014-05-14 DIAGNOSIS — R51 Headache: Secondary | ICD-10-CM | POA: Insufficient documentation

## 2014-05-14 DIAGNOSIS — I10 Essential (primary) hypertension: Secondary | ICD-10-CM | POA: Insufficient documentation

## 2014-05-14 DIAGNOSIS — Z3202 Encounter for pregnancy test, result negative: Secondary | ICD-10-CM | POA: Insufficient documentation

## 2014-05-14 DIAGNOSIS — R519 Headache, unspecified: Secondary | ICD-10-CM

## 2014-05-14 DIAGNOSIS — Z8659 Personal history of other mental and behavioral disorders: Secondary | ICD-10-CM | POA: Insufficient documentation

## 2014-05-14 LAB — CBC WITH DIFFERENTIAL/PLATELET
BASOS ABS: 0 10*3/uL (ref 0.0–0.1)
Basophils Relative: 0 % (ref 0–1)
Eosinophils Absolute: 0.2 10*3/uL (ref 0.0–0.7)
Eosinophils Relative: 2 % (ref 0–5)
HCT: 40.6 % (ref 36.0–46.0)
Hemoglobin: 13.1 g/dL (ref 12.0–15.0)
LYMPHS PCT: 23 % (ref 12–46)
Lymphs Abs: 2.3 10*3/uL (ref 0.7–4.0)
MCH: 27 pg (ref 26.0–34.0)
MCHC: 32.3 g/dL (ref 30.0–36.0)
MCV: 83.5 fL (ref 78.0–100.0)
Monocytes Absolute: 0.5 10*3/uL (ref 0.1–1.0)
Monocytes Relative: 5 % (ref 3–12)
NEUTROS ABS: 6.9 10*3/uL (ref 1.7–7.7)
NEUTROS PCT: 70 % (ref 43–77)
PLATELETS: 278 10*3/uL (ref 150–400)
RBC: 4.86 MIL/uL (ref 3.87–5.11)
RDW: 14.9 % (ref 11.5–15.5)
WBC: 9.8 10*3/uL (ref 4.0–10.5)

## 2014-05-14 LAB — I-STAT CHEM 8, ED
BUN: 13 mg/dL (ref 6–23)
CHLORIDE: 101 meq/L (ref 96–112)
Calcium, Ion: 1.15 mmol/L (ref 1.12–1.23)
Creatinine, Ser: 0.7 mg/dL (ref 0.50–1.10)
Glucose, Bld: 102 mg/dL — ABNORMAL HIGH (ref 70–99)
HCT: 45 % (ref 36.0–46.0)
HEMOGLOBIN: 15.3 g/dL — AB (ref 12.0–15.0)
Potassium: 3.8 mmol/L (ref 3.5–5.1)
SODIUM: 139 mmol/L (ref 135–145)
TCO2: 22 mmol/L (ref 0–100)

## 2014-05-14 LAB — URINALYSIS, ROUTINE W REFLEX MICROSCOPIC
Bilirubin Urine: NEGATIVE
GLUCOSE, UA: NEGATIVE mg/dL
Hgb urine dipstick: NEGATIVE
KETONES UR: NEGATIVE mg/dL
LEUKOCYTES UA: NEGATIVE
NITRITE: NEGATIVE
PROTEIN: NEGATIVE mg/dL
Specific Gravity, Urine: 1.016 (ref 1.005–1.030)
UROBILINOGEN UA: 0.2 mg/dL (ref 0.0–1.0)
pH: 5.5 (ref 5.0–8.0)

## 2014-05-14 LAB — POC URINE PREG, ED: Preg Test, Ur: NEGATIVE

## 2014-05-14 MED ORDER — IBUPROFEN 400 MG PO TABS: 400.0000 mg | ORAL_TABLET | Freq: Four times a day (QID) | ORAL | Status: DC | PRN

## 2014-05-14 MED ORDER — NAPROXEN 250 MG PO TABS
500.0000 mg | ORAL_TABLET | Freq: Once | ORAL | Status: AC
  Administered 2014-05-14: 500 mg via ORAL
  Filled 2014-05-14: qty 2

## 2014-05-14 NOTE — Discharge Instructions (Signed)
Please follow up with your primary care physician in 1-2 days. If you do not have one please call the [place] number listed above. Please follow up with the [facility] to schedule a follow up appointment.  Please read all discharge instructions and return precautions.  Hypertension Hypertension, commonly called high blood pressure, is when the force of blood pumping through your arteries is too strong. Your arteries are the blood vessels that carry blood from your heart throughout your body. A blood pressure reading consists of a higher number over a lower number, such as 110/72. The higher number (systolic) is the pressure inside your arteries when your heart pumps. The lower number (diastolic) is the pressure inside your arteries when your heart relaxes. Ideally you want your blood pressure below 120/80. Hypertension forces your heart to work harder to pump blood. Your arteries may become narrow or stiff. Having hypertension puts you at risk for heart disease, stroke, and other problems.  RISK FACTORS Some risk factors for high blood pressure are controllable. Others are not.  Risk factors you cannot control include:   Race. You may be at higher risk if you are African American.  Age. Risk increases with age.  Gender. Men are at higher risk than women before age 23 years. After age 54, women are at higher risk than men. Risk factors you can control include:  Not getting enough exercise or physical activity.  Being overweight.  Getting too much fat, sugar, calories, or salt in your diet.  Drinking too much alcohol. SIGNS AND SYMPTOMS Hypertension does not usually cause signs or symptoms. Extremely high blood pressure (hypertensive crisis) may cause headache, anxiety, shortness of breath, and nosebleed. DIAGNOSIS  To check if you have hypertension, your health care provider will measure your blood pressure while you are seated, with your arm held at the level  of your heart. It should be measured at least twice using the same arm. Certain conditions can cause a difference in blood pressure between your right and left arms. A blood pressure reading that is higher than normal on one occasion does not mean that you need treatment. If one blood pressure reading is high, ask your health care provider about having it checked again. TREATMENT  Treating high blood pressure includes making lifestyle changes and possibly taking medicine. Living a healthy lifestyle can help lower high blood pressure. You may need to change some of your habits. Lifestyle changes may include:  Following the DASH diet. This diet is high in fruits, vegetables, and whole grains. It is low in salt, red meat, and added sugars.  Getting at least 2 hours of brisk physical activity every week.  Losing weight if necessary.  Not smoking.  Limiting alcoholic beverages.  Learning ways to reduce stress. If lifestyle changes are not enough to get your blood pressure under control, your health care provider may prescribe medicine. You may need to take more than one. Work closely with your health care provider to understand the risks and benefits. HOME CARE INSTRUCTIONS  Have your blood pressure rechecked as directed by your health care provider.   Take medicines only as directed by your health care provider. Follow the directions carefully. Blood pressure medicines must be taken as prescribed. The medicine does not work as well when you skip doses. Skipping doses also puts you at risk for problems.   Do not smoke.   Monitor your blood pressure at home as directed by your health care provider.  SEEK MEDICAL CARE IF:   You think you are having a reaction to medicines taken.  You have recurrent headaches or feel dizzy.  You have swelling in your ankles.  You have trouble with your vision. SEEK IMMEDIATE MEDICAL CARE IF:  You develop a severe headache or confusion.  You have  unusual weakness, numbness, or feel faint.  You have severe chest or abdominal pain.  You vomit repeatedly.  You have trouble breathing. MAKE SURE YOU:   Understand these instructions.  Will watch your condition.  Will get help right away if you are not doing well or get worse. Document Released: 05/01/2005 Document Revised: 09/15/2013 Document Reviewed: 02/21/2013 [facility] Patient Information 2015 [place], [place]. This information is not intended to replace advice given to you by your health care provider. Make sure you discuss any questions you have with your health care provider.  General Headache Without Cause A headache is pain or discomfort felt around the head or neck area. The specific cause of a headache may not be found. There are many causes and types of headaches. A few common ones are:  Tension headaches.  Migraine headaches.  Cluster headaches.  Chronic daily headaches. HOME CARE INSTRUCTIONS   Keep all follow-up appointments with your caregiver or any specialist referral.  Only take over-the-counter or prescription medicines for pain or discomfort as directed by your caregiver.  Lie down in a dark, quiet room when you have a headache.  Keep a headache journal to find out what may trigger your migraine headaches. For example, write down:  What you eat and drink.  How much sleep you get.  Any change to your diet or medicines.  Try massage or other relaxation techniques.  Put ice packs or heat on the head and neck. Use these 3 to 4 times per day for 15 to 20 minutes each time, or as needed.  Limit stress.  Sit up straight, and do not tense your muscles.  Quit smoking if you smoke.  Limit alcohol use.  Decrease the amount of caffeine you drink, or stop drinking caffeine.  Eat and sleep on a regular schedule.  Get 7 to 9 hours of sleep, or as recommended by your caregiver.  Keep lights dim if bright lights bother you and make your headaches  worse. SEEK MEDICAL CARE IF:   You have problems with the medicines you were prescribed.  Your medicines are not working.  You have a change from the usual headache.  You have nausea or vomiting. SEEK IMMEDIATE MEDICAL CARE IF:   Your headache becomes severe.  You have a fever.  You have a stiff neck.  You have loss of vision.  You have muscular weakness or loss of muscle control.  You start losing your balance or have trouble walking.  You feel faint or pass out.  You have severe symptoms that are different from your first symptoms. MAKE SURE YOU:   Understand these instructions.  Will watch your condition.  Will get help right away if you are not doing well or get worse. Document Released: 05/01/2005 Document Revised: 07/24/2011 Document Reviewed: 05/17/2011 [facility] Patient Information 2015 [place], [place]. This information is not intended to replace advice given to you by your health care provider. Make sure you discuss any questions you have with your health care provider.

## 2014-05-14 NOTE — ED Notes (Signed)
PA Anderson Malta at bedside.

## 2014-05-14 NOTE — ED Notes (Signed)
Presents from [place] with c/o HTN. BP 173/118, requesting pregnancy test as well. Symptoms associated with headache-ongoing for 2-3 months-denies taking HTN medication.  LMP Dec 13-17 which was abnormal.

## 2014-05-14 NOTE — ED Notes (Addendum)
Pt st's she is staying at the [facility] and was told by nurse on staff that her blood pressure was high.  Pt st's she may be preg. LMP this month  Pt also c/o abd pain x's 2 months with breast tenderness.  Pt denies vag discharge

## 2014-05-14 NOTE — ED Provider Notes (Signed)
CSN: [id number]     Arrival date & time 05/14/14  1614 History   First MD Initiated Contact with Patient 05/14/14 1846     Chief Complaint  Patient presents with  . Hypertension     (Consider location/radiation/quality/duration/timing/severity/associated sxs/prior Treatment) HPI Comments: Patient is a 48 year old female presenting to the emergency department from [place] [facility] for evaluation of multiple complaints. She was advised to come to the emergency department for evaluation of hypertension, they noted her blood pressure was 173/118. Patient is unsure if she's ever had a history of high blood pressure. She has not ever been on any blood pressure medications. Patient also states that she has had intermittent episodes of posterior throbbing headaches for the last 2-3 months, they're intermittent she has tried Aleve with improvement. She is also concerned she may be pregnant, she endorses that she has had some breast tenderness, last menstrual period was December 13-17. She is also complaining about multiple months of worsening pelvic cramping with her menstrual cycles. She is not currently having this right now. She has not been seen by a primary care physician or an OB/GYN for these complaints.  Patient is a 48 y.o. female presenting with hypertension.  Hypertension Associated symptoms include headaches. Pertinent negatives include no abdominal pain, chills, fever, numbness or weakness.    Past Medical History  Diagnosis Date  . Anxiety    History reviewed. No pertinent past surgical history. No family history on file. History  Substance Use Topics  . Smoking status: Never Smoker   . Smokeless tobacco: Not on file  . Alcohol Use: Yes     Comment: social   OB History    No data available     Review of Systems  Constitutional: Negative for fever and chills.  Eyes: Negative for visual disturbance.  Gastrointestinal: Negative for abdominal pain.  Genitourinary: Negative for  vaginal bleeding, vaginal discharge and vaginal pain.  Neurological: Positive for headaches. Negative for syncope, weakness and numbness.  All other systems reviewed and are negative.     Allergies  Review of patient's allergies indicates no known allergies.  Home Medications   Prior to Admission medications   Medication Sig Start Date End Date Taking? Authorizing Provider  ibuprofen (ADVIL,MOTRIN) 400 MG tablet Take 1 tablet (400 mg total) by mouth every 6 (six) hours as needed. 05/14/14   Edgar Reisz L Diane Hanel, PA-C   BP 141/95 mmHg  Pulse 75  Temp(Src) 98.5 F (36.9 C) (Oral)  Resp 16  SpO2 100%  LMP 05/06/2014 Physical Exam  Constitutional: She is oriented to person, place, and time. She appears well-developed and well-nourished. No distress.  HENT:  Head: Normocephalic and atraumatic.  Right Ear: External ear normal.  Left Ear: External ear normal.  Nose: Nose normal.  Mouth/Throat: Oropharynx is clear and moist. No oropharyngeal exudate.  Eyes: Conjunctivae and EOM are normal. Pupils are equal, round, and reactive to light.  Neck: Normal range of motion. Neck supple.  Cardiovascular: Normal rate, regular rhythm, normal heart sounds and intact distal pulses.   Pulmonary/Chest: Effort normal and breath sounds normal. No respiratory distress.  Abdominal: Soft. There is no tenderness.  Neurological: She is alert and oriented to person, place, and time. She has normal strength. No cranial nerve deficit. Gait normal. GCS eye subscore is 4. GCS verbal subscore is 5. GCS motor subscore is 6.  Sensation grossly intact.  No pronator drift.  Bilateral heel-knee-shin intact.  Skin: Skin is warm and dry. She is not diaphoretic.  Nursing note and vitals reviewed.   ED Course  Procedures (including critical care time) Medications  naproxen (NAPROSYN) tablet 500 mg (500 mg Oral Given 05/14/14 1929)    Labs Review Labs Reviewed  I-STAT CHEM 8, ED - Abnormal; Notable for the  following:    Glucose, Bld 102 (*)    Hemoglobin 15.3 (*)    All other components within normal limits  CBC WITH DIFFERENTIAL  URINALYSIS, ROUTINE W REFLEX MICROSCOPIC  POC URINE PREG, ED  POC URINE PREG, ED    Imaging Review No results found.   EKG Interpretation None      MDM   Final diagnoses:  Essential hypertension  Bad headache    Filed Vitals:   05/14/14 1949  BP: 141/95  Pulse: 75  Temp: 98.5 F (36.9 C)  Resp: 16   Afebrile, NAD, non-toxic appearing, AAOx4. I have reviewed nursing notes, vital signs, and all appropriate lab and imaging results for this patient.  1) HTN: Patient noted to be hypertensive in the emergency department.  No signs of hypertensive urgency.  Discussed with patient the need for close follow-up and management by their primary care physician.   2) HA: Pt HA treated and improved while in ED.  Presentation is like pts typical HA and non concerning for [facility], ICH, Meningitis, or temporal arteritis. Pt is afebrile with no focal neuro deficits, nuchal rigidity, or change in vision. Pt is to follow up with PCP to discuss prophylactic medication. Pt verbalizes understanding and is agreeable with plan to [place], PA-C 05/14/14 [place] Alvino Chapel, MD 05/15/14 [phone]

## 2014-05-17 ENCOUNTER — Emergency Department (HOSPITAL_COMMUNITY)
Admission: EM | Admit: 2014-05-17 | Discharge: 2014-05-18 | Disposition: A | Discharge status: Home / Self Care | Payer: Self-pay | Attending: Emergency Medicine | Admitting: Emergency Medicine

## 2014-05-17 ENCOUNTER — Encounter (HOSPITAL_COMMUNITY): Payer: Self-pay | Admitting: Emergency Medicine

## 2014-05-17 DIAGNOSIS — Z8659 Personal history of other mental and behavioral disorders: Secondary | ICD-10-CM | POA: Insufficient documentation

## 2014-05-17 DIAGNOSIS — E669 Obesity, unspecified: Secondary | ICD-10-CM | POA: Insufficient documentation

## 2014-05-17 DIAGNOSIS — R519 Headache, unspecified: Secondary | ICD-10-CM

## 2014-05-17 DIAGNOSIS — R51 Headache: Secondary | ICD-10-CM | POA: Insufficient documentation

## 2014-05-17 HISTORY — DX: Obesity, unspecified: E66.9

## 2014-05-17 LAB — CBC WITH DIFFERENTIAL/PLATELET
Basophils Absolute: 0 10*3/uL (ref 0.0–0.1)
Basophils Relative: 0 % (ref 0–1)
EOS PCT: 2 % (ref 0–5)
Eosinophils Absolute: 0.2 10*3/uL (ref 0.0–0.7)
HEMATOCRIT: 40.3 % (ref 36.0–46.0)
Hemoglobin: 13 g/dL (ref 12.0–15.0)
LYMPHS ABS: 2.7 10*3/uL (ref 0.7–4.0)
LYMPHS PCT: 24 % (ref 12–46)
MCH: 26.4 pg (ref 26.0–34.0)
MCHC: 32.3 g/dL (ref 30.0–36.0)
MCV: 81.9 fL (ref 78.0–100.0)
MONO ABS: 0.6 10*3/uL (ref 0.1–1.0)
Monocytes Relative: 5 % (ref 3–12)
NEUTROS ABS: 7.6 10*3/uL (ref 1.7–7.7)
Neutrophils Relative %: 69 % (ref 43–77)
Platelets: 287 10*3/uL (ref 150–400)
RBC: 4.92 MIL/uL (ref 3.87–5.11)
RDW: 15 % (ref 11.5–15.5)
WBC: 11.1 10*3/uL — AB (ref 4.0–10.5)

## 2014-05-17 LAB — COMPREHENSIVE METABOLIC PANEL
ALBUMIN: 3.5 g/dL (ref 3.5–5.2)
ALT: 15 U/L (ref 0–35)
AST: 22 U/L (ref 0–37)
Alkaline Phosphatase: 65 U/L (ref 39–117)
Anion gap: 7 (ref 5–15)
BUN: 15 mg/dL (ref 6–23)
CALCIUM: 8.9 mg/dL (ref 8.4–10.5)
CO2: 24 mmol/L (ref 19–32)
Chloride: 104 mEq/L (ref 96–112)
Creatinine, Ser: 1.23 mg/dL — ABNORMAL HIGH (ref 0.50–1.10)
GFR calc Af Amer: 59 mL/min — ABNORMAL LOW (ref >90)
GFR, EST NON AFRICAN AMERICAN: 51 mL/min — AB (ref >90)
Glucose, Bld: 117 mg/dL — ABNORMAL HIGH (ref 70–99)
Potassium: 3.9 mmol/L (ref 3.5–5.1)
SODIUM: 135 mmol/L (ref 135–145)
Total Bilirubin: 0.5 mg/dL (ref 0.3–1.2)
Total Protein: 7.6 g/dL (ref 6.0–8.3)

## 2014-05-17 MED ORDER — IBUPROFEN 400 MG PO TABS
600.0000 mg | ORAL_TABLET | Freq: Once | ORAL | Status: AC
  Administered 2014-05-17: 600 mg via ORAL
  Filled 2014-05-17 (×2): qty 1

## 2014-05-17 NOTE — Discharge Instructions (Signed)
General Headache Without Cause A general headache is pain or discomfort felt around the head or neck area. The cause may not be found.  HOME CARE   Keep all doctor visits.  Only take medicines as told by your doctor.  Lie down in a dark, quiet room when you have a headache.  Keep a journal to find out if certain things bring on headaches. For example, write down:  What you eat and drink.  How much sleep you get.  Any change to your diet or medicines.  Relax by getting a massage or doing other relaxing activities.  Put ice or heat packs on the head and neck area as told by your doctor.  Lessen stress.  Sit up straight. Do not tighten (tense) your muscles.  Quit smoking if you smoke.  Lessen how much alcohol you drink.  Lessen how much caffeine you drink, or stop drinking caffeine.  Eat and sleep on a regular schedule.  Get 7 to 9 hours of sleep, or as told by your doctor.  Keep lights dim if bright lights bother you or make your headaches worse. GET HELP RIGHT AWAY IF:   Your headache becomes really bad.  You have a fever.  You have a stiff neck.  You have trouble seeing.  Your muscles are weak, or you lose muscle control.  You lose your balance or have trouble walking.  You feel like you will pass out (faint), or you pass out.  You have really bad symptoms that are different than your first symptoms.  You have problems with the medicines given to you by your doctor.  Your medicines do not work.  Your headache feels different than the other headaches.  You feel sick to your stomach (nauseous) or throw up (vomit). MAKE SURE YOU:   Understand these instructions.  Will watch your condition.  Will get help right away if you are not doing well or get worse. Document Released: 02/08/2008 Document Revised: 07/24/2011 Document Reviewed: 04/21/2011 [facility] Patient Information 2015 [place], [place]. This information is not intended to replace advice given to  you by your health care provider. Make sure you discuss any questions you have with your health care provider.  Headaches, Frequently Asked Questions MIGRAINE HEADACHES Q: What is migraine? What causes it? How can I treat it? A: Generally, migraine headaches begin as a dull ache. Then they develop into a constant, throbbing, and pulsating pain. You may experience pain at the temples. You may experience pain at the front or back of one or both sides of the head. The pain is usually accompanied by a combination of:  Nausea.  Vomiting.  Sensitivity to light and noise. Some people (about 15%) experience an aura (see below) before an attack. The cause of migraine is believed to be chemical reactions in the brain. Treatment for migraine may include over-the-counter or prescription medications. It may also include self-help techniques. These include relaxation training and biofeedback.  Q: What is an aura? A: About 15% of people with migraine get an "aura". This is a sign of neurological symptoms that occur before a migraine headache. You may see wavy or jagged lines, dots, or flashing lights. You might experience tunnel vision or blind spots in one or both eyes. The aura can include visual or auditory hallucinations (something imagined). It may include disruptions in smell (such as strange odors), taste or touch. Other symptoms include:  Numbness.  A "pins and needles" sensation.  Difficulty in recalling or speaking the  correct word. These neurological events may last as long as 60 minutes. These symptoms will fade as the headache begins. Q: What is a trigger? A: Certain physical or environmental factors can lead to or "trigger" a migraine. These include:  Foods.  Hormonal changes.  Weather.  Stress. It is important to remember that triggers are different for everyone. To help prevent migraine attacks, you need to figure out which triggers affect you. Keep a headache diary. This is a good  way to track triggers. The diary will help you talk to your healthcare professional about your condition. Q: Does weather affect migraines? A: Bright sunshine, hot, humid conditions, and drastic changes in barometric pressure may lead to, or "trigger," a migraine attack in some people. But studies have shown that weather does not act as a trigger for everyone with migraines. Q: What is the link between migraine and hormones? A: Hormones start and regulate many of your body's functions. Hormones keep your body in balance within a constantly changing environment. The levels of hormones in your body are unbalanced at times. Examples are during menstruation, pregnancy, or menopause. That can lead to a migraine attack. In fact, about three quarters of all women with migraine report that their attacks are related to the menstrual cycle.  Q: Is there an increased risk of stroke for migraine sufferers? A: The likelihood of a migraine attack causing a stroke is very remote. That is not to say that migraine sufferers cannot have a stroke associated with their migraines. In persons under age 90, the most common associated factor for stroke is migraine headache. But over the course of a person's normal life span, the occurrence of migraine headache may actually be associated with a reduced risk of dying from cerebrovascular disease due to stroke.  Q: What are acute medications for migraine? A: Acute medications are used to treat the pain of the headache after it has started. Examples over-the-counter medications, NSAIDs, ergots, and triptans.  Q: What are the triptans? A: Triptans are the newest class of abortive medications. They are specifically targeted to treat migraine. Triptans are vasoconstrictors. They moderate some chemical reactions in the brain. The triptans work on receptors in your brain. Triptans help to restore the balance of a neurotransmitter called serotonin. Fluctuations in levels of serotonin are  thought to be a main cause of migraine.  Q: Are over-the-counter medications for migraine effective? A: Over-the-counter, or "OTC," medications may be effective in relieving mild to moderate pain and associated symptoms of migraine. But you should see your caregiver before beginning any treatment regimen for migraine.  Q: What are preventive medications for migraine? A: Preventive medications for migraine are sometimes referred to as "prophylactic" treatments. They are used to reduce the frequency, severity, and length of migraine attacks. Examples of preventive medications include antiepileptic medications, antidepressants, beta-blockers, calcium channel blockers, and NSAIDs (nonsteroidal anti-inflammatory drugs). Q: Why are anticonvulsants used to treat migraine? A: During the past few years, there has been an increased interest in antiepileptic drugs for the prevention of migraine. They are sometimes referred to as "anticonvulsants". Both epilepsy and migraine may be caused by similar reactions in the brain.  Q: Why are antidepressants used to treat migraine? A: Antidepressants are typically used to treat people with depression. They may reduce migraine frequency by regulating chemical levels, such as serotonin, in the brain.  Q: What alternative therapies are used to treat migraine? A: The term "alternative therapies" is often used to describe treatments considered outside the  scope of conventional Western medicine. Examples of alternative therapy include acupuncture, acupressure, and yoga. Another common alternative treatment is herbal therapy. Some herbs are believed to relieve headache pain. Always discuss alternative therapies with your caregiver before proceeding. Some herbal products contain arsenic and other toxins. TENSION HEADACHES Q: What is a tension-type headache? What causes it? How can I treat it? A: Tension-type headaches occur randomly. They are often the result of temporary stress,  anxiety, fatigue, or anger. Symptoms include soreness in your temples, a tightening band-like sensation around your head (a "vice-like" ache). Symptoms can also include a pulling feeling, pressure sensations, and contracting head and neck muscles. The headache begins in your forehead, temples, or the back of your head and neck. Treatment for tension-type headache may include over-the-counter or prescription medications. Treatment may also include self-help techniques such as relaxation training and biofeedback. CLUSTER HEADACHES Q: What is a cluster headache? What causes it? How can I treat it? A: Cluster headache gets its name because the attacks come in groups. The pain arrives with little, if any, warning. It is usually on one side of the head. A tearing or bloodshot eye and a runny nose on the same side of the headache may also accompany the pain. Cluster headaches are believed to be caused by chemical reactions in the brain. They have been described as the most severe and intense of any headache type. Treatment for cluster headache includes prescription medication and oxygen. SINUS HEADACHES Q: What is a sinus headache? What causes it? How can I treat it? A: When a cavity in the bones of the face and skull (a sinus) becomes inflamed, the inflammation will cause localized pain. This condition is usually the result of an allergic reaction, a tumor, or an infection. If your headache is caused by a sinus blockage, such as an infection, you will probably have a fever. An x-ray will confirm a sinus blockage. Your caregiver's treatment might include antibiotics for the infection, as well as antihistamines or decongestants.  REBOUND HEADACHES Q: What is a rebound headache? What causes it? How can I treat it? A: A pattern of taking acute headache medications too often can lead to a condition known as "rebound headache." A pattern of taking too much headache medication includes taking it more than 2 days per  week or in excessive amounts. That means more than the label or a caregiver advises. With rebound headaches, your medications not only stop relieving pain, they actually begin to cause headaches. Doctors treat rebound headache by tapering the medication that is being overused. Sometimes your caregiver will gradually substitute a different type of treatment or medication. Stopping may be a challenge. Regularly overusing a medication increases the potential for serious side effects. Consult a caregiver if you regularly use headache medications more than 2 days per week or more than the label advises. ADDITIONAL QUESTIONS AND ANSWERS Q: What is biofeedback? A: Biofeedback is a self-help treatment. Biofeedback uses special equipment to monitor your body's involuntary physical responses. Biofeedback monitors:  Breathing.  Pulse.  Heart rate.  Temperature.  Muscle tension.  Brain activity. Biofeedback helps you refine and perfect your relaxation exercises. You learn to control the physical responses that are related to stress. Once the technique has been mastered, you do not need the equipment any more. Q: Are headaches hereditary? A: Four out of five (80%) of people that suffer report a family history of migraine. Scientists are not sure if this is genetic or a family predisposition. Despite  the uncertainty, a child has a 50% chance of having migraine if one parent suffers. The child has a 75% chance if both parents suffer.  Q: Can children get headaches? A: By the time they reach high school, most young people have experienced some type of headache. Many safe and effective approaches or medications can prevent a headache from occurring or stop it after it has begun.  Q: What type of doctor should I see to diagnose and treat my headache? A: Start with your primary caregiver. Discuss his or her experience and approach to headaches. Discuss methods of classification, diagnosis, and treatment. Your  caregiver may decide to recommend you to a headache specialist, depending upon your symptoms or other physical conditions. Having diabetes, allergies, etc., may require a more comprehensive and inclusive approach to your headache. The National Headache Foundation will provide, upon request, a list of [facility] physician members in your state. Document Released: 07/22/2003 Document Revised: 07/24/2011 Document Reviewed: 12/30/2007 [facility] Patient Information 2015 [place], [place]. This information is not intended to replace advice given to you by your health care provider. Make sure you discuss any questions you have with your health care provider.

## 2014-05-17 NOTE — ED Notes (Signed)
Pt. reports intermittent frontal headache onset today , denies injury , no nausea or blurred vision . No fever or chills.

## 2014-05-17 NOTE — ED Provider Notes (Signed)
CSN: [id number]     Arrival date & time 05/17/14  2155 History  This chart was scribed for Kalman Drape, MD by Rayfield Citizen, ED Scribe. This patient was seen in room B16C/B16C and the patient's care was started at 11:25 PM.    Chief Complaint  Patient presents with  . Headache   The history is provided by the patient. No language interpreter was used.   HPI Comments: Emma Shepherd is a 49 y.o. female who presents to the Emergency Department complaining of 3 months of intermittent headache, localized around bilateral temples and eyes. She visited the [facility] two weeks PTA and received ibuprofen to treat her headache; this improved her pain. Patient went to another hospital [facility]) in October 2015 for the same symptoms and received several tests - she was told "nothing was wrong." She has an appointment with pcp on Thurs 1/7.   Patient reports that she has been taking her blood pressure recently and has had "high readings" which concerns her - e.g. 168/110. She has not been recording these readings regularly but feels that her headache is related.   She was seen here for headache and HTN two days PTA, 1/1.   She also reports 4-6 months of intermittent abdominal pain; she describes this as a "cramping" pain, both before and after her menstrual cycle, as well as breast tenderness. She notes occasional instances of upper abdominal pain with SOB, worse with twisting or bending. She has not seen a PCP for this issue.   She is currently living in the [employer].   Past Medical History  Diagnosis Date  . Anxiety   . Obesity    History reviewed. No pertinent past surgical history. No family history on file. History  Substance Use Topics  . Smoking status: Never Smoker   . Smokeless tobacco: Not on file  . Alcohol Use: Yes     Comment: social   OB History    No data available     Review of Systems  Gastrointestinal: Positive for abdominal pain.  Neurological: Positive for  headaches.    Allergies  Review of patient's allergies indicates no known allergies.  Home Medications   Prior to Admission medications   Medication Sig Start Date End Date Taking? Authorizing Provider  ibuprofen (ADVIL,MOTRIN) 400 MG tablet Take 1 tablet (400 mg total) by mouth every 6 (six) hours as needed. 05/14/14  Yes Jennifer L Piepenbrink, PA-C   BP 149/112 mmHg  Pulse 103  Temp(Src) 98.9 F (37.2 C) (Oral)  Resp 17  Ht 5\' 4"  (1.626 m)  Wt 317 lb (143.79 kg)  BMI 54.39 kg/m2  SpO2 96%  LMP 04/26/2014 Physical Exam  Constitutional: She is oriented to person, place, and time. She appears well-developed and well-nourished. No distress.  HENT:  Head: Normocephalic and atraumatic.  Right Ear: External ear normal.  Left Ear: External ear normal.  Nose: Nose normal.  Mouth/Throat: Oropharynx is clear and moist.  Eyes: Conjunctivae and EOM are normal. Pupils are equal, round, and reactive to light.  Neck: Normal range of motion. Neck supple. No JVD present. No tracheal deviation present. No thyromegaly present.  Cardiovascular: Normal rate, regular rhythm, normal heart sounds and intact distal pulses.  Exam reveals no gallop and no friction rub.   No murmur heard. Pulmonary/Chest: Effort normal and breath sounds normal. No stridor. No respiratory distress. She has no wheezes. She has no rales. She exhibits no tenderness.  Abdominal: Soft. Bowel sounds are normal.  She exhibits no distension and no mass. There is no tenderness. There is no rebound and no guarding.  Musculoskeletal: Normal range of motion. She exhibits no edema or tenderness.  Lymphadenopathy:    She has no cervical adenopathy.  Neurological: She is alert and oriented to person, place, and time. She displays normal reflexes. No cranial nerve deficit. She exhibits normal muscle tone. Coordination normal.  Skin: Skin is warm and dry. No rash noted. No erythema. No pallor.  Psychiatric: She has a normal mood and  affect. Her behavior is normal. Judgment and thought content normal.  Nursing note and vitals reviewed.   ED Course  Procedures   DIAGNOSTIC STUDIES: Oxygen Saturation is 96% on RA, normal by my interpretation.    COORDINATION OF CARE: 11:33 PM Discussed treatment plan with pt at bedside and pt agreed to plan.   Labs Review Labs Reviewed  CBC WITH DIFFERENTIAL - Abnormal; Notable for the following:    WBC 11.1 (*)    All other components within normal limits  COMPREHENSIVE METABOLIC PANEL - Abnormal; Notable for the following:    Glucose, Bld 117 (*)    Creatinine, Ser 1.23 (*)    GFR calc non Af Amer 51 (*)    GFR calc Af Amer 59 (*)    All other components within normal limits    Imaging Review No results found.   EKG Interpretation None      MDM   Final diagnoses:  Headache disorder   49 year old female with intermittent headaches for several months, seen 2 days ago for same.  Patient is also concerned about hypertension.  She has follow-up on Thursday.  Exam at this time is nonfocal.  She is stable for discharge.  I personally performed the services described in this documentation, which was scribed in my presence. The recorded information has been reviewed and is accurate.       Kalman Drape, MD 05/18/14 [phone]

## 2014-05-21 ENCOUNTER — Encounter: Payer: Self-pay | Admitting: Internal Medicine

## 2014-05-21 ENCOUNTER — Ambulatory Visit: Payer: Self-pay | Attending: Internal Medicine | Admitting: Internal Medicine

## 2014-05-21 VITALS — BP 153/102 | HR 102 | Temp 98.3°F | Resp 16 | Ht 64.0 in | Wt 280.0 lb

## 2014-05-21 DIAGNOSIS — R030 Elevated blood-pressure reading, without diagnosis of hypertension: Secondary | ICD-10-CM

## 2014-05-21 DIAGNOSIS — R519 Headache, unspecified: Secondary | ICD-10-CM

## 2014-05-21 DIAGNOSIS — Z590 Homelessness: Secondary | ICD-10-CM | POA: Insufficient documentation

## 2014-05-21 DIAGNOSIS — E669 Obesity, unspecified: Secondary | ICD-10-CM | POA: Insufficient documentation

## 2014-05-21 DIAGNOSIS — F419 Anxiety disorder, unspecified: Secondary | ICD-10-CM | POA: Insufficient documentation

## 2014-05-21 DIAGNOSIS — I10 Essential (primary) hypertension: Secondary | ICD-10-CM | POA: Insufficient documentation

## 2014-05-21 DIAGNOSIS — IMO0001 Reserved for inherently not codable concepts without codable children: Secondary | ICD-10-CM

## 2014-05-21 DIAGNOSIS — R51 Headache: Secondary | ICD-10-CM

## 2014-05-21 MED ORDER — ACETAMINOPHEN 325 MG PO TABS: 650.0000 mg | ORAL_TABLET | Freq: Four times a day (QID) | ORAL | Status: DC | PRN

## 2014-05-21 NOTE — Progress Notes (Signed)
Patient went hospital 12/31 headaches BP was 168/110 Taken to [facility] from [facility] her advil and [facility] Patient states she is not pregnant-did urine in hospital Not fasting Needs flu shot

## 2014-05-21 NOTE — Patient Instructions (Signed)
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
## 2014-05-25 ENCOUNTER — Ambulatory Visit: Payer: Self-pay | Admitting: Internal Medicine

## 2014-07-13 ENCOUNTER — Encounter (HOSPITAL_COMMUNITY): Payer: Self-pay | Admitting: *Deleted

## 2014-07-13 ENCOUNTER — Emergency Department (HOSPITAL_COMMUNITY)
Admission: EM | Admit: 2014-07-13 | Discharge: 2014-07-14 | Disposition: A | Discharge status: Home / Self Care | Payer: Self-pay | Attending: Emergency Medicine | Admitting: Emergency Medicine

## 2014-07-13 DIAGNOSIS — Z8659 Personal history of other mental and behavioral disorders: Secondary | ICD-10-CM | POA: Insufficient documentation

## 2014-07-13 DIAGNOSIS — I10 Essential (primary) hypertension: Secondary | ICD-10-CM | POA: Insufficient documentation

## 2014-07-13 DIAGNOSIS — R51 Headache: Secondary | ICD-10-CM | POA: Insufficient documentation

## 2014-07-13 DIAGNOSIS — R519 Headache, unspecified: Secondary | ICD-10-CM

## 2014-07-13 DIAGNOSIS — E669 Obesity, unspecified: Secondary | ICD-10-CM | POA: Insufficient documentation

## 2014-07-13 DIAGNOSIS — Z3202 Encounter for pregnancy test, result negative: Secondary | ICD-10-CM | POA: Insufficient documentation

## 2014-07-13 LAB — COMPREHENSIVE METABOLIC PANEL WITH GFR
ALT: 17 U/L (ref 0–35)
AST: 18 U/L (ref 0–37)
Albumin: 4.2 g/dL (ref 3.5–5.2)
Alkaline Phosphatase: 70 U/L (ref 39–117)
Anion gap: 9 (ref 5–15)
BUN: 12 mg/dL (ref 6–23)
CO2: 26 mmol/L (ref 19–32)
Calcium: 9 mg/dL (ref 8.4–10.5)
Chloride: 103 mmol/L (ref 96–112)
Creatinine, Ser: 0.73 mg/dL (ref 0.50–1.10)
GFR calc Af Amer: >90 mL/min
GFR calc non Af Amer: >90 mL/min
Glucose, Bld: 98 mg/dL (ref 70–99)
Potassium: 3.4 mmol/L — ABNORMAL LOW (ref 3.5–5.1)
Sodium: 138 mmol/L (ref 135–145)
Total Bilirubin: 0.9 mg/dL (ref 0.3–1.2)
Total Protein: 8.3 g/dL (ref 6.0–8.3)

## 2014-07-13 LAB — URINALYSIS, ROUTINE W REFLEX MICROSCOPIC
Bilirubin Urine: NEGATIVE
Glucose, UA: NEGATIVE mg/dL
Hgb urine dipstick: NEGATIVE
Ketones, ur: NEGATIVE mg/dL
Leukocytes, UA: NEGATIVE
Nitrite: NEGATIVE
Protein, ur: NEGATIVE mg/dL
Specific Gravity, Urine: 1.019 (ref 1.005–1.030)
Urobilinogen, UA: 0.2 mg/dL (ref 0.0–1.0)
pH: 5.5 (ref 5.0–8.0)

## 2014-07-13 LAB — CBC WITH DIFFERENTIAL/PLATELET
Basophils Absolute: 0 10*3/uL (ref 0.0–0.1)
Basophils Relative: 0 % (ref 0–1)
Eosinophils Absolute: 0.1 10*3/uL (ref 0.0–0.7)
Eosinophils Relative: 1 % (ref 0–5)
HEMATOCRIT: 41.9 % (ref 36.0–46.0)
HEMOGLOBIN: 13.2 g/dL (ref 12.0–15.0)
LYMPHS PCT: 23 % (ref 12–46)
Lymphs Abs: 2.4 10*3/uL (ref 0.7–4.0)
MCH: 26.4 pg (ref 26.0–34.0)
MCHC: 31.5 g/dL (ref 30.0–36.0)
MCV: 83.8 fL (ref 78.0–100.0)
MONOS PCT: 4 % (ref 3–12)
Monocytes Absolute: 0.4 10*3/uL (ref 0.1–1.0)
Neutro Abs: 7.4 10*3/uL (ref 1.7–7.7)
Neutrophils Relative %: 72 % (ref 43–77)
Platelets: 269 10*3/uL (ref 150–400)
RBC: 5 MIL/uL (ref 3.87–5.11)
RDW: 14.9 % (ref 11.5–15.5)
WBC: 10.3 10*3/uL (ref 4.0–10.5)

## 2014-07-13 LAB — PREGNANCY, URINE: Preg Test, Ur: NEGATIVE

## 2014-07-13 MED ORDER — SODIUM CHLORIDE 0.9 % IV BOLUS (SEPSIS): 1000.0000 mL | Freq: Once | INTRAVENOUS | Status: DC

## 2014-07-13 MED ORDER — KETOROLAC TROMETHAMINE 30 MG/ML IJ SOLN
30.0000 mg | Freq: Once | INTRAMUSCULAR | Status: AC
  Administered 2014-07-13: 30 mg via INTRAMUSCULAR
  Filled 2014-07-13: qty 1

## 2014-07-13 MED ORDER — IBUPROFEN 600 MG PO TABS: 600.0000 mg | ORAL_TABLET | Freq: Four times a day (QID) | ORAL | Status: DC | PRN

## 2014-07-13 NOTE — ED Provider Notes (Signed)
CSN: [id number]     Arrival date & time 07/13/14  1958 History   First MD Initiated Contact with Patient 07/13/14 2111     Chief Complaint  Patient presents with  . Headache   Emma Shepherd is a 49 y.o. female with a history of frequent headaches and hypertension who presents to the ED complaining of a frontal headache with onset of this morning. She reports having frequent, almost daily headaches. Currently she is planning of a frontal headache behind her eyes. She reports this is her typical headache and is unchanged from her previous. She came to the ED today because she was concerned that it had lasted all day. She reports taking ibuprofen for a HA last night that resolved, but has not taken anything for treatment today. Currently she rates her pain at 6/10. She also reports she has not eaten in ten hours and thinks this might contribute to her headache as well as stress. The patient denies fevers, chills, changes or vision, numbness, tingling, weakness, abdominal pain, nausea, vomiting, dizziness, lightheadedness, trauma to her head, or ear pain. The patient has been seen by PCP and diagnosed with hypertension, but does not take blood pressure medicine at this time.   (Consider location/radiation/quality/duration/timing/severity/associated sxs/prior Treatment) HPI  Past Medical History  Diagnosis Date  . Anxiety   . Obesity    History reviewed. No pertinent past surgical history. No family history on file. History  Substance Use Topics  . Smoking status: Never Smoker   . Smokeless tobacco: Not on file  . Alcohol Use: Yes     Comment: social   OB History    No data available     Review of Systems  Constitutional: Negative for fever and chills.  HENT: Negative for congestion, ear pain, hearing loss, rhinorrhea, sinus pressure, sneezing, sore throat and trouble swallowing.   Eyes: Negative for pain and visual disturbance.  Respiratory: Negative for cough, shortness of breath and  wheezing.   Cardiovascular: Negative for chest pain and palpitations.  Gastrointestinal: Negative for nausea, vomiting, abdominal pain and diarrhea.  Genitourinary: Negative for dysuria.  Musculoskeletal: Negative for back pain and neck pain.  Skin: Negative for rash.  Neurological: Positive for headaches. Negative for dizziness, syncope, weakness, light-headedness and numbness.      Allergies  Review of patient's allergies indicates no known allergies.  Home Medications   Prior to Admission medications   Medication Sig Start Date End Date Taking? Authorizing Provider  acetaminophen (TYLENOL) 325 MG tablet Take 2 tablets (650 mg total) by mouth every 6 (six) hours as needed. 05/21/14  Yes Lance Bosch, NP  ibuprofen (ADVIL,MOTRIN) 600 MG tablet Take 1 tablet (600 mg total) by mouth every 6 (six) hours as needed. 07/13/14   Verda Cumins Briannie Gutierrez, PA-C   BP 145/73 mmHg  Pulse 84  Temp(Src) 97.9 F (36.6 C) (Oral)  Resp 20  SpO2 100%  LMP 05/12/2014 Physical Exam  Constitutional: She is oriented to person, place, and time. She appears well-developed and well-nourished. No distress.  HENT:  Head: Normocephalic and atraumatic.  Right Ear: External ear normal.  Left Ear: External ear normal.  Nose: Nose normal.  Mouth/Throat: Oropharynx is clear and moist. No oropharyngeal exudate.  Bilateral tympanic membranes are pearly-gray without erythema or loss of landmarks. No temporal edema or tenderness.   Eyes: Conjunctivae and EOM are normal. Pupils are equal, round, and reactive to light. Right eye exhibits no discharge. Left eye exhibits no discharge.  Neck: Normal range  of motion. Neck supple. No JVD present.  No meningeal signs.   Cardiovascular: Normal rate, regular rhythm, normal heart sounds and intact distal pulses.  Exam reveals no gallop and no friction rub.   No murmur heard. Pulmonary/Chest: Effort normal and breath sounds normal. No respiratory distress. She has no wheezes.  She has no rales.  Abdominal: Soft. She exhibits no distension. There is no tenderness.  Musculoskeletal: She exhibits no edema.  Patient is spontaneously moving all extremities in a coordinated fashion exhibiting good strength.  Lymphadenopathy:    She has no cervical adenopathy.  Neurological: She is alert and oriented to person, place, and time. No cranial nerve deficit. Coordination normal.  Cranial nerves intact. No pronator drift. Sensation is intact in her bilateral face and upper and lower extremities Finger to nose intact bilaterally.   Skin: Skin is warm and dry. No rash noted. She is not diaphoretic. No erythema. No pallor.  Psychiatric: She has a normal mood and affect. Her behavior is normal.  Nursing note and vitals reviewed.   ED Course  Procedures (including critical care time) Labs Review Labs Reviewed  COMPREHENSIVE METABOLIC PANEL - Abnormal; Notable for the following:    Potassium 3.4 (*)    All other components within normal limits  CBC WITH DIFFERENTIAL/PLATELET  URINALYSIS, ROUTINE W REFLEX MICROSCOPIC  PREGNANCY, URINE    Imaging Review No results found.   EKG Interpretation None      Filed Vitals:   07/13/14 2004 07/13/14 2030 07/13/14 2134 07/14/14 0006  BP: 142/109 145/101 137/86 145/73  Pulse: 98  74 84  Temp: 97.9 F (36.6 C)  97.8 F (36.6 C) 97.9 F (36.6 C)  TempSrc: Oral  Oral Oral  Resp: 18  20 20   SpO2: 95%  96% 100%     MDM   Meds given in ED:  Medications  ketorolac (TORADOL) 30 MG/ML injection 30 mg (30 mg Intramuscular Given 07/13/14 2220)    Discharge Medication List as of 07/13/2014 11:57 PM      Final diagnoses:  Bad headache    This is a 49 y.o. female with a history of frequent headaches and hypertension who presents to the ED complaining of a frontal headache with onset of this morning. She reports having frequent, almost daily headaches. Currently she is planning of a frontal headache behind her eyes. She reports  this is her typical headache and is unchanged from her previous. She came to the ED today because she was concerned that it had lasted all day. Patient is afebrile nontoxic-appearing. She has no focal neuro deficits. CBC, CMP and urinalysis are unremarkable.  Pt HA treated and improved while in ED.  Presentation is like pts typical HA and non concerning for [facility], ICH, Meningitis, or temporal arteritis. Pt is afebrile with no focal neuro deficits, nuchal rigidity, or change in vision. Pt is to follow up with PCP to discuss prophylactic medication.  Ad reevaluation the patient reports all of her symptoms have completely resolved and she feels ready discharge. She has tolerated water and crackers prior to discharge. I advised the patient to follow-up with their primary care provider this week. I advised the patient to return to the emergency department with new or worsening symptoms or new concerns. The patient verbalized understanding and agreement with plan.       Hanley Hays, PA-C 07/14/14 0149  Orpah Greek, MD 07/14/14 2217

## 2014-07-13 NOTE — Discharge Instructions (Signed)

## 2014-07-13 NOTE — ED Notes (Signed)
Pt reports h/a x 2 months, states she came tonight because she has taken motrin without relief this time.  Pt's BP is also elevated-no reported hx of HTN at this time.  Pt is A&Ox 4.  No neuro deficits at this time.

## 2014-07-14 NOTE — ED Notes (Signed)
Awake. Verbally responsive. A/O x4. Resp even and unlabored. No audible adventitious breath sounds noted. ABC's intact.  

## 2014-08-06 ENCOUNTER — Ambulatory Visit: Payer: Self-pay | Admitting: Internal Medicine

## 2014-08-06 ENCOUNTER — Encounter: Payer: Self-pay | Admitting: Internal Medicine

## 2014-08-06 ENCOUNTER — Ambulatory Visit: Payer: Self-pay | Attending: Internal Medicine | Admitting: Internal Medicine

## 2014-08-06 VITALS — BP 145/95 | HR 104 | Temp 98.7°F | Resp 16 | Ht 64.0 in | Wt 275.0 lb

## 2014-08-06 DIAGNOSIS — R51 Headache: Secondary | ICD-10-CM | POA: Insufficient documentation

## 2014-08-06 DIAGNOSIS — I10 Essential (primary) hypertension: Secondary | ICD-10-CM | POA: Insufficient documentation

## 2014-08-06 MED ORDER — HYDROCHLOROTHIAZIDE 25 MG PO TABS: 25.0000 mg | ORAL_TABLET | Freq: Every day | ORAL | Status: DC

## 2014-08-06 NOTE — Patient Instructions (Signed)
DASH Eating Plan °DASH stands for "Dietary Approaches to Stop Hypertension." The DASH eating plan is a healthy eating plan that has been shown to reduce high blood pressure (hypertension). Additional health benefits may include reducing the risk of type 2 diabetes mellitus, heart disease, and stroke. The DASH eating plan may also help with weight loss. °WHAT DO I NEED TO KNOW ABOUT THE DASH EATING PLAN? °For the DASH eating plan, you will follow these general guidelines: °· Choose foods with a percent daily value for sodium of less than 5% (as listed on the food label). °· Use salt-free seasonings or herbs instead of table salt or sea salt. °· Check with your health care provider or pharmacist before using salt substitutes. °· Eat lower-sodium products, often labeled as "lower sodium" or "no salt added." °· Eat fresh foods. °· Eat more vegetables, fruits, and low-fat dairy products. °· Choose whole grains. Look for the word "whole" as the first word in the ingredient list. °· Choose fish and skinless chicken or turkey more often than red meat. Limit fish, poultry, and meat to 6 oz (170 g) each day. °· Limit sweets, desserts, sugars, and sugary drinks. °· Choose heart-healthy fats. °· Limit cheese to 1 oz (28 g) per day. °· Eat more home-cooked food and less restaurant, buffet, and fast food. °· Limit fried foods. °· Cook foods using methods other than frying. °· Limit canned vegetables. If you do use them, rinse them well to decrease the sodium. °· When eating at a restaurant, ask that your food be prepared with less salt, or no salt if possible. °WHAT FOODS CAN I EAT? °Seek help from a dietitian for individual calorie needs. °Grains °Whole grain or whole wheat bread. Brown rice. Whole grain or whole wheat pasta. Quinoa, bulgur, and whole grain cereals. Low-sodium cereals. Corn or whole wheat flour tortillas. Whole grain cornbread. Whole grain crackers. Low-sodium crackers. °Vegetables °Fresh or frozen vegetables  (raw, steamed, roasted, or grilled). Low-sodium or reduced-sodium tomato and vegetable juices. Low-sodium or reduced-sodium tomato sauce and paste. Low-sodium or reduced-sodium canned vegetables.  °Fruits °All fresh, canned (in natural juice), or frozen fruits. °Meat and Other Protein Products °Ground beef (85% or leaner), grass-fed beef, or beef trimmed of fat. Skinless chicken or turkey. Ground chicken or turkey. Pork trimmed of fat. All fish and seafood. Eggs. Dried beans, peas, or lentils. Unsalted nuts and seeds. Unsalted canned beans. °Dairy °Low-fat dairy products, such as skim or 1% milk, 2% or reduced-fat cheeses, low-fat ricotta or cottage cheese, or plain low-fat yogurt. Low-sodium or reduced-sodium cheeses. °Fats and Oils °Tub margarines without trans fats. Light or reduced-fat mayonnaise and salad dressings (reduced sodium). Avocado. Safflower, olive, or canola oils. Natural peanut or almond butter. °Other °Unsalted popcorn and pretzels. °The items listed above may not be a complete list of recommended foods or beverages. Contact your dietitian for more options. °WHAT FOODS ARE NOT RECOMMENDED? °Grains °White bread. White pasta. White rice. Refined cornbread. Bagels and croissants. Crackers that contain trans fat. °Vegetables °Creamed or fried vegetables. Vegetables in a cheese sauce. Regular canned vegetables. Regular canned tomato sauce and paste. Regular tomato and vegetable juices. °Fruits °Dried fruits. Canned fruit in light or heavy syrup. Fruit juice. °Meat and Other Protein Products °Fatty cuts of meat. Ribs, chicken wings, bacon, sausage, bologna, salami, chitterlings, fatback, hot dogs, bratwurst, and packaged luncheon meats. Salted nuts and seeds. Canned beans with salt. °Dairy °Whole or 2% milk, cream, half-and-half, and cream cheese. Whole-fat or sweetened yogurt. Full-fat   cheeses or blue cheese. Nondairy creamers and whipped toppings. Processed cheese, cheese spreads, or cheese  curds. °Condiments °Onion and garlic salt, seasoned salt, table salt, and sea salt. Canned and packaged gravies. Worcestershire sauce. Tartar sauce. Barbecue sauce. Teriyaki sauce. Soy sauce, including reduced sodium. Steak sauce. Fish sauce. Oyster sauce. Cocktail sauce. Horseradish. Ketchup and mustard. Meat flavorings and tenderizers. Bouillon cubes. Hot sauce. Tabasco sauce. Marinades. Taco seasonings. Relishes. °Fats and Oils °Butter, stick margarine, lard, shortening, ghee, and bacon fat. Coconut, palm kernel, or palm oils. Regular salad dressings. °Other °Pickles and olives. Salted popcorn and pretzels. °The items listed above may not be a complete list of foods and beverages to avoid. Contact your dietitian for more information. °WHERE CAN I FIND MORE INFORMATION? °National Heart, Lung, and [employer] Institute: www.nhlbi.nih.gov/health/health-topics/topics/dash/ °Document Released: 04/20/2011 Document Revised: 09/15/2013 Document Reviewed: 03/05/2013 °ExitCare® Patient Information ©2015 [employer]. This information is not intended to replace advice given to you by your health care provider. Make sure you discuss any questions you have with your health care provider. ° °

## 2014-08-06 NOTE — Progress Notes (Signed)
Patient here to find out if she has HTN-she has been having headaches on and off the last couple of months Patient due for pap and mammogram

## 2014-08-06 NOTE — Progress Notes (Signed)
Patient ID: Emma Shepherd, female   DOB: Jul 28, 1965, 49 y.o.   MRN: [id number]  CC: headaches, elevated BP  HPI: Emma Shepherd is a 49 y.o. female here today for a follow up visit.  Patient has past medical history of anxiety and obesity.  She reports that she has continued to have frequent headaches and has been to the ER for persistent pain. She states that since her last visit she has been able to get housing in a local boarding house. She still continues to eat at churches and soup kitchens which tend to have a high salt content. She does not currently exercise daily but plans to begin a regimen.   Patient has No chest pain, No abdominal pain - No Nausea, No new weakness tingling or numbness, No Cough - SOB.  No Known Allergies Past Medical History  Diagnosis Date  . Anxiety   . Obesity    No current outpatient prescriptions on file prior to visit.   No current facility-administered medications on file prior to visit.   History reviewed. No pertinent family history. History   Social History  . Marital Status: Divorced    Spouse Name: N/A  . Number of Children: N/A  . Years of Education: N/A   Occupational History  . Not on file.   Social History Main Topics  . Smoking status: Never Smoker   . Smokeless tobacco: Not on file  . Alcohol Use: Yes     Comment: social  . Drug Use: No  . Sexual Activity: Yes    Birth Control/ Protection: None   Other Topics Concern  . Not on file   Social History Narrative    Review of Systems  Cardiovascular: Positive for leg swelling.  Neurological: Positive for headaches.  All other systems reviewed and are negative.     Objective:   Filed Vitals:   08/06/14 1434  BP: 145/95  Pulse: 104  Temp: 98.7 F (37.1 C)  Resp: 16    Physical Exam  Constitutional: She is oriented to person, place, and time.  Cardiovascular: Normal rate, regular rhythm and normal heart sounds.   Pulmonary/Chest: Effort normal and breath  sounds normal.  Abdominal: Soft. Bowel sounds are normal.  Musculoskeletal: She exhibits no edema.  Neurological: She is alert and oriented to person, place, and time.  Skin: Skin is warm and dry.     Lab Results  Component Value Date   WBC 10.3 07/13/2014   HGB 13.2 07/13/2014   HCT 41.9 07/13/2014   MCV 83.8 07/13/2014   PLT 269 07/13/2014   Lab Results  Component Value Date   CREATININE 0.73 07/13/2014   BUN 12 07/13/2014   NA 138 07/13/2014   K 3.4* 07/13/2014   CL 103 07/13/2014   CO2 26 07/13/2014    No results found for: HGBA1C Lipid Panel  No results found for: CHOL, TRIG, HDL, CHOLHDL, VLDL, LDLCALC     Assessment and plan:   Emma Shepherd was seen today for headache.  Diagnoses and all orders for this visit:  Essential hypertension Orders: -    Begin hydrochlorothiazide (HYDRODIURIL) 25 MG tablet; Take 1 tablet (25 mg total) by mouth daily.  I have advised patient to begin taking BP medication which will also help with headaches. I still do not feel patient is motivated to start taking medication at this time. I have went over long term complications of untreated hypertension. Patient verbalizes understanding.      Return in about 2 weeks (  around 08/20/2014) for pap/breast. I will recheck BP at that time    Chari Manning, [place] [facility] [phone] 08/06/2014, 2:45 PM

## 2014-08-13 ENCOUNTER — Emergency Department (HOSPITAL_COMMUNITY)
Admission: EM | Admit: 2014-08-13 | Discharge: 2014-08-13 | Disposition: A | Discharge status: Home / Self Care | Payer: Self-pay | Attending: Emergency Medicine | Admitting: Emergency Medicine

## 2014-08-13 ENCOUNTER — Encounter (HOSPITAL_COMMUNITY): Payer: Self-pay | Admitting: *Deleted

## 2014-08-13 DIAGNOSIS — Z8659 Personal history of other mental and behavioral disorders: Secondary | ICD-10-CM | POA: Insufficient documentation

## 2014-08-13 DIAGNOSIS — N939 Abnormal uterine and vaginal bleeding, unspecified: Secondary | ICD-10-CM | POA: Insufficient documentation

## 2014-08-13 DIAGNOSIS — E669 Obesity, unspecified: Secondary | ICD-10-CM | POA: Insufficient documentation

## 2014-08-13 DIAGNOSIS — Z3202 Encounter for pregnancy test, result negative: Secondary | ICD-10-CM | POA: Insufficient documentation

## 2014-08-13 DIAGNOSIS — Z79899 Other long term (current) drug therapy: Secondary | ICD-10-CM | POA: Insufficient documentation

## 2014-08-13 LAB — URINE MICROSCOPIC-ADD ON

## 2014-08-13 LAB — CBC WITH DIFFERENTIAL/PLATELET
BASOS ABS: 0 10*3/uL (ref 0.0–0.1)
BASOS PCT: 0 % (ref 0–1)
Eosinophils Absolute: 0.2 10*3/uL (ref 0.0–0.7)
Eosinophils Relative: 2 % (ref 0–5)
HCT: 43.1 % (ref 36.0–46.0)
HEMOGLOBIN: 13.8 g/dL (ref 12.0–15.0)
Lymphocytes Relative: 25 % (ref 12–46)
Lymphs Abs: 2.3 10*3/uL (ref 0.7–4.0)
MCH: 26.3 pg (ref 26.0–34.0)
MCHC: 32 g/dL (ref 30.0–36.0)
MCV: 82.3 fL (ref 78.0–100.0)
Monocytes Absolute: 0.3 10*3/uL (ref 0.1–1.0)
Monocytes Relative: 4 % (ref 3–12)
NEUTROS PCT: 69 % (ref 43–77)
Neutro Abs: 6.2 10*3/uL (ref 1.7–7.7)
Platelets: 255 10*3/uL (ref 150–400)
RBC: 5.24 MIL/uL — ABNORMAL HIGH (ref 3.87–5.11)
RDW: 15 % (ref 11.5–15.5)
WBC: 9.1 10*3/uL (ref 4.0–10.5)

## 2014-08-13 LAB — URINALYSIS, ROUTINE W REFLEX MICROSCOPIC
Bilirubin Urine: NEGATIVE
Glucose, UA: NEGATIVE mg/dL
KETONES UR: 15 mg/dL — AB
LEUKOCYTES UA: NEGATIVE
NITRITE: NEGATIVE
Protein, ur: 30 mg/dL — AB
Specific Gravity, Urine: 1.027 (ref 1.005–1.030)
UROBILINOGEN UA: 0.2 mg/dL (ref 0.0–1.0)
pH: 5 (ref 5.0–8.0)

## 2014-08-13 LAB — COMPREHENSIVE METABOLIC PANEL
ALK PHOS: 69 U/L (ref 39–117)
ALT: 16 U/L (ref 0–35)
AST: 16 U/L (ref 0–37)
Albumin: 3.7 g/dL (ref 3.5–5.2)
Anion gap: 9 (ref 5–15)
BILIRUBIN TOTAL: 0.7 mg/dL (ref 0.3–1.2)
BUN: 9 mg/dL (ref 6–23)
CALCIUM: 9.3 mg/dL (ref 8.4–10.5)
CO2: 25 mmol/L (ref 19–32)
Chloride: 105 mmol/L (ref 96–112)
Creatinine, Ser: 0.72 mg/dL (ref 0.50–1.10)
GFR calc non Af Amer: >90 mL/min (ref >90)
Glucose, Bld: 96 mg/dL (ref 70–99)
POTASSIUM: 3.7 mmol/L (ref 3.5–5.1)
Sodium: 139 mmol/L (ref 135–145)
Total Protein: 8.3 g/dL (ref 6.0–8.3)

## 2014-08-13 LAB — I-STAT BETA HCG BLOOD, ED (MC, WL, AP ONLY)

## 2014-08-13 LAB — LIPASE, BLOOD: Lipase: 27 U/L (ref 11–59)

## 2014-08-13 MED ORDER — NAPROXEN 500 MG PO TABS: 500.0000 mg | ORAL_TABLET | Freq: Two times a day (BID) | ORAL | Status: DC

## 2014-08-13 NOTE — ED Provider Notes (Signed)
CSN: [id number]     Arrival date & time 08/13/14  1720 History   First MD Initiated Contact with Patient 08/13/14 1814     Chief Complaint  Patient presents with  . Possible Pregnancy  . Abdominal Pain  . Back Pain     (Consider location/radiation/quality/duration/timing/severity/associated sxs/prior Treatment) HPI Emma Shepherd is a 49 y.o. female with history of anxiety, presents to emergency department complaining of abdominal pain. Patient states that she has had lower abdominal cramping for several days. She states she hasn't had a period in about 3 months, so when the cramping started she checked a pregnancy test which came back positive. States yesterday she started having vaginal bleeding which made her concerned since she thought she was pregnant so she decided to come to emergency department. She denies any fever, chills. She denies any urinary symptoms. She denies any back pain. No nausea or vomiting. No diarrhea. She has not taken anything at home for her cramping.  Past Medical History  Diagnosis Date  . Anxiety   . Obesity    History reviewed. No pertinent past surgical history. History reviewed. No pertinent family history. History  Substance Use Topics  . Smoking status: Never Smoker   . Smokeless tobacco: Not on file  . Alcohol Use: Yes     Comment: social   OB History    No data available     Review of Systems  Constitutional: Negative for fever and chills.  Respiratory: Negative for cough, chest tightness and shortness of breath.   Cardiovascular: Negative for chest pain, palpitations and leg swelling.  Gastrointestinal: Positive for abdominal pain. Negative for nausea, vomiting and diarrhea.  Genitourinary: Positive for vaginal bleeding and pelvic pain. Negative for dysuria, flank pain, vaginal discharge and vaginal pain.  Musculoskeletal: Negative for myalgias, arthralgias, neck pain and neck stiffness.  Skin: Negative for rash.  Neurological: Negative  for dizziness, weakness and headaches.  All other systems reviewed and are negative.     Allergies  Review of patient's allergies indicates no known allergies.  Home Medications   Prior to Admission medications   Medication Sig Start Date End Date Taking? Authorizing Provider  hydrochlorothiazide (HYDRODIURIL) 25 MG tablet Take 1 tablet (25 mg total) by mouth daily. 08/06/14   Lance Bosch, NP   BP 149/100 mmHg  Pulse 76  Temp(Src) 98.3 F (36.8 C) (Oral)  Resp 12  SpO2 100%  LMP 05/11/2014 (Approximate) Physical Exam  Constitutional: She appears well-developed and well-nourished. No distress.  HENT:  Head: Normocephalic.  Eyes: Conjunctivae are normal.  Neck: Neck supple.  Cardiovascular: Normal rate, regular rhythm and normal heart sounds.   Pulmonary/Chest: Effort normal and breath sounds normal. No respiratory distress. She has no wheezes. She has no rales.  Abdominal: Soft. Bowel sounds are normal. She exhibits no distension. There is tenderness. There is no rebound.  Suprapubic tenderness  Genitourinary:  Normal external genitalia. Normal vaginal canal. Small blood in vaginal canal. Cervix is normal, closed. No CMT. No uterine or adnexal tenderness. No masses palpated.    Musculoskeletal: She exhibits no edema.  Neurological: She is alert.  Skin: Skin is warm and dry.  Psychiatric: She has a normal mood and affect. Her behavior is normal.  Nursing note and vitals reviewed.   ED Course  Procedures (including critical care time) Labs Review Labs Reviewed  CBC WITH DIFFERENTIAL/PLATELET - Abnormal; Notable for the following:    RBC 5.24 (*)    All other components within normal limits  URINALYSIS, ROUTINE W REFLEX MICROSCOPIC - Abnormal; Notable for the following:    Color, Urine AMBER (*)    APPearance CLOUDY (*)    Hgb urine dipstick LARGE (*)    Ketones, ur 15 (*)    Protein, ur 30 (*)    All other components within normal limits  URINE MICROSCOPIC-ADD  ON - Abnormal; Notable for the following:    Bacteria, UA FEW (*)    All other components within normal limits  COMPREHENSIVE METABOLIC PANEL  LIPASE, BLOOD  I-STAT BETA HCG BLOOD, ED ([facility], [facility], [facility] ONLY)  GC/CHLAMYDIA PROBE AMP ([place])    Imaging Review No results found.   EKG Interpretation None      MDM   Final diagnoses:  Vaginal bleeding    Pt with possible positive pregnancy test at home after not having a period for 3 months. Vaginal bleeding since this morning. Will get preg test, pelvic exam, labs.    8:24 PM Pt's pregnancy test is negative. Pelvic exam unremarkable with no tenderness, small blood in vaginal canal. Question cramping due to her menstrual cycle. And question possible onset of menopause given no period for 3 months. Will start on naprosyn for cramping. Follow up with gyn.    Jeannett Senior, PA-C 08/14/14 0001  Dorie Rank, MD 08/15/14 [phone]

## 2014-08-13 NOTE — Discharge Instructions (Signed)
Naprosyn for cramping and inflammation. Follow up with primary care doctor for further check up if symptoms continue.    Abnormal Uterine Bleeding Abnormal uterine bleeding can affect women at various stages in life, including teenagers, women in their reproductive years, pregnant women, and women who have reached menopause. Several kinds of uterine bleeding are considered abnormal, including:  Bleeding or spotting between periods.   Bleeding after sexual intercourse.   Bleeding that is heavier or more than normal.   Periods that last longer than usual.  Bleeding after menopause.  Many cases of abnormal uterine bleeding are minor and simple to treat, while others are more serious. Any type of abnormal bleeding should be evaluated by your health care provider. Treatment will depend on the cause of the bleeding. HOME CARE INSTRUCTIONS Monitor your condition for any changes. The following actions may help to alleviate any discomfort you are experiencing:  Avoid the use of tampons and douches as directed by your health care provider.  Change your pads frequently. You should get regular pelvic exams and Pap tests. Keep all follow-up appointments for diagnostic tests as directed by your health care provider.  SEEK MEDICAL CARE IF:   Your bleeding lasts more than 1 week.   You feel dizzy at times.  SEEK IMMEDIATE MEDICAL CARE IF:   You pass out.   You are changing pads every 15 to 30 minutes.   You have abdominal pain.  You have a fever.   You become sweaty or weak.   You are passing large blood clots from the vagina.   You start to feel nauseous and vomit. MAKE SURE YOU:   Understand these instructions.  Will watch your condition.  Will get help right away if you are not doing well or get worse. Document Released: 05/01/2005 Document Revised: 05/06/2013 Document Reviewed: 11/28/2012 [facility] Patient Information 2015 [place], [place]. This information is not  intended to replace advice given to you by your health care provider. Make sure you discuss any questions you have with your health care provider.

## 2014-08-13 NOTE — ED Notes (Signed)
Pt reports a positive pregnancy test over the weekend. Pt reports vaginal bleeding, abdominal cramping and back pain today. Pt denies lightheadedness or dizziness.

## 2014-08-14 LAB — GC/CHLAMYDIA PROBE AMP (~~LOC~~) NOT AT ARMC
Chlamydia: NEGATIVE
Neisseria Gonorrhea: NEGATIVE

## 2014-08-18 ENCOUNTER — Ambulatory Visit: Payer: Self-pay | Attending: Internal Medicine | Admitting: Internal Medicine

## 2014-08-18 ENCOUNTER — Encounter: Payer: Self-pay | Admitting: Internal Medicine

## 2014-08-18 VITALS — BP 130/93 | HR 79 | Temp 98.4°F | Resp 16 | Ht 64.0 in | Wt 277.0 lb

## 2014-08-18 DIAGNOSIS — Z01419 Encounter for gynecological examination (general) (routine) without abnormal findings: Secondary | ICD-10-CM | POA: Insufficient documentation

## 2014-08-18 DIAGNOSIS — Z124 Encounter for screening for malignant neoplasm of cervix: Secondary | ICD-10-CM

## 2014-08-18 DIAGNOSIS — Z0000 Encounter for general adult medical examination without abnormal findings: Secondary | ICD-10-CM

## 2014-08-18 DIAGNOSIS — I10 Essential (primary) hypertension: Secondary | ICD-10-CM | POA: Insufficient documentation

## 2014-08-18 LAB — POCT URINALYSIS DIPSTICK
Bilirubin, UA: NEGATIVE
GLUCOSE UA: NEGATIVE
Ketones, UA: NEGATIVE
Leukocytes, UA: NEGATIVE
NITRITE UA: NEGATIVE
PH UA: 6.5
Protein, UA: NEGATIVE
Spec Grav, UA: 1.02
Urobilinogen, UA: 0.2

## 2014-08-18 LAB — POCT URINE PREGNANCY: PREG TEST UR: NEGATIVE

## 2014-08-18 NOTE — Patient Instructions (Addendum)
Please call Rolena Infante, [phone],  with the BCCCP (breast and cervical cancer control program) at the [facility] to set up an appointment to verify eligibility for a breast exam, mammogram, ultrasound. If you qualify this will be set up at [facility].  Please get BP medication

## 2014-08-18 NOTE — Progress Notes (Signed)
Pt is here for a pap smear and physical. Pt was prescribed a BP medication last week. She was unable to afford her medication.
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
## 2014-08-19 LAB — CERVICOVAGINAL ANCILLARY ONLY
CHLAMYDIA, DNA PROBE: NEGATIVE
Neisseria Gonorrhea: NEGATIVE
WET PREP (BD AFFIRM): NEGATIVE

## 2014-08-19 LAB — CYTOLOGY - PAP

## 2014-08-20 ENCOUNTER — Telehealth: Payer: Self-pay | Admitting: *Deleted

## 2014-08-20 NOTE — Telephone Encounter (Signed)
Pt is aware of her pap smear results.

## 2014-08-20 NOTE — Telephone Encounter (Signed)
-----   Message from Lance Bosch, NP sent at 08/19/2014  5:18 PM EDT ----- Patient pap is negative for  infections. Waiting on cytology results

## 2014-09-03 ENCOUNTER — Telehealth: Payer: Self-pay | Admitting: Internal Medicine

## 2014-09-03 NOTE — Telephone Encounter (Signed)
Pt calling for results of last procedures.  Pt reports that abd pain still persists and would like to know of if she can be tested to see if she has cysts.  Please f/u with pt.

## 2014-09-04 ENCOUNTER — Ambulatory Visit: Payer: Self-pay

## 2014-09-07 ENCOUNTER — Telehealth: Payer: Self-pay | Admitting: *Deleted

## 2014-09-07 NOTE — Telephone Encounter (Signed)
Pt is aware of her pap results.

## 2014-09-07 NOTE — Telephone Encounter (Signed)
-----   Message from Lance Bosch, NP sent at 09/03/2014  9:07 AM EDT ----- Patients pap showed abnormal cells but NO HPV. We will recheck in 1 year. If it continues to be abnormal in one year we may possible to a biopsy of tissues.

## 2014-10-13 ENCOUNTER — Encounter (HOSPITAL_COMMUNITY): Payer: Self-pay | Admitting: *Deleted

## 2014-10-13 ENCOUNTER — Emergency Department (HOSPITAL_COMMUNITY)
Admission: EM | Admit: 2014-10-13 | Discharge: 2014-10-19 | Disposition: A | Discharge status: Home / Self Care | Payer: Self-pay | Attending: Emergency Medicine | Admitting: Emergency Medicine

## 2014-10-13 DIAGNOSIS — Z8659 Personal history of other mental and behavioral disorders: Secondary | ICD-10-CM | POA: Insufficient documentation

## 2014-10-13 DIAGNOSIS — E669 Obesity, unspecified: Secondary | ICD-10-CM | POA: Insufficient documentation

## 2014-10-13 DIAGNOSIS — R451 Restlessness and agitation: Secondary | ICD-10-CM | POA: Insufficient documentation

## 2014-10-13 DIAGNOSIS — I10 Essential (primary) hypertension: Secondary | ICD-10-CM | POA: Insufficient documentation

## 2014-10-13 DIAGNOSIS — F312 Bipolar disorder, current episode manic severe with psychotic features: Secondary | ICD-10-CM | POA: Diagnosis present

## 2014-10-13 DIAGNOSIS — F332 Major depressive disorder, recurrent severe without psychotic features: Secondary | ICD-10-CM | POA: Diagnosis present

## 2014-10-13 DIAGNOSIS — Z046 Encounter for general psychiatric examination, requested by authority: Secondary | ICD-10-CM | POA: Insufficient documentation

## 2014-10-13 HISTORY — DX: Essential (primary) hypertension: I10

## 2014-10-13 LAB — CBC
HEMATOCRIT: 44.4 % (ref 36.0–46.0)
Hemoglobin: 14.3 g/dL (ref 12.0–15.0)
MCH: 26.7 pg (ref 26.0–34.0)
MCHC: 32.2 g/dL (ref 30.0–36.0)
MCV: 83 fL (ref 78.0–100.0)
PLATELETS: 275 10*3/uL (ref 150–400)
RBC: 5.35 MIL/uL — ABNORMAL HIGH (ref 3.87–5.11)
RDW: 15.6 % — AB (ref 11.5–15.5)
WBC: 10.5 10*3/uL (ref 4.0–10.5)

## 2014-10-13 LAB — COMPREHENSIVE METABOLIC PANEL
ALK PHOS: 80 U/L (ref 38–126)
ALT: 18 U/L (ref 14–54)
ANION GAP: 10 (ref 5–15)
AST: 20 U/L (ref 15–41)
Albumin: 4.1 g/dL (ref 3.5–5.0)
BUN: 11 mg/dL (ref 6–20)
CHLORIDE: 104 mmol/L (ref 101–111)
CO2: 23 mmol/L (ref 22–32)
Calcium: 9 mg/dL (ref 8.9–10.3)
Creatinine, Ser: 0.76 mg/dL (ref 0.44–1.00)
GFR calc Af Amer: >60 mL/min (ref >60)
GFR calc non Af Amer: >60 mL/min (ref >60)
GLUCOSE: 128 mg/dL — AB (ref 65–99)
POTASSIUM: 3.4 mmol/L — AB (ref 3.5–5.1)
Sodium: 137 mmol/L (ref 135–145)
Total Bilirubin: 0.5 mg/dL (ref 0.3–1.2)
Total Protein: 8.4 g/dL — ABNORMAL HIGH (ref 6.5–8.1)

## 2014-10-13 LAB — SALICYLATE LEVEL: Salicylate Lvl: <4 mg/dL (ref 2.8–30.0)

## 2014-10-13 LAB — ETHANOL

## 2014-10-13 LAB — ACETAMINOPHEN LEVEL: Acetaminophen (Tylenol), Serum: <10 ug/mL — ABNORMAL LOW (ref 10–30)
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
## 2014-10-13 NOTE — ED Notes (Signed)
Patient reports feeling upset about being brought in by [employer]. Denies SI, HI, AVH.  Patient appears irritated, brief interaction with Probation officer.  Encouragement offered. Oriented to the unit.  Q 15 safety checks in place.
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
## 2014-10-13 NOTE — ED Notes (Signed)
Bed: BVP36 Expected date:  Expected time:  Means of arrival:  Comments: Hold for triage 5

## 2014-10-13 NOTE — ED Notes (Signed)
Pt. and belongings wanded by security 

## 2014-10-14 DIAGNOSIS — F332 Major depressive disorder, recurrent severe without psychotic features: Secondary | ICD-10-CM

## 2014-10-14 LAB — RAPID URINE DRUG SCREEN, HOSP PERFORMED
AMPHETAMINES: NOT DETECTED
BENZODIAZEPINES: NOT DETECTED
Barbiturates: NOT DETECTED
COCAINE: NOT DETECTED
Opiates: NOT DETECTED
TETRAHYDROCANNABINOL: NOT DETECTED

## 2014-10-14 MED ORDER — TRAZODONE HCL 100 MG PO TABS
100.0000 mg | ORAL_TABLET | Freq: Every evening | ORAL | Status: DC | PRN
  Filled 2014-10-14: qty 1

## 2014-10-14 MED ORDER — CARBAMAZEPINE 200 MG PO TABS
200.0000 mg | ORAL_TABLET | Freq: Two times a day (BID) | ORAL | Status: DC
Start: 2014-10-14 — End: 2014-10-16
  Filled 2014-10-14 (×6): qty 1

## 2014-10-14 NOTE — ED Notes (Signed)
Patient calm, cooperative. Denies SI, HI, AVH. Denies feelings of anxiety and depression. Reports that she does not understand why the IVC was not reversed and she be discharged. Denies any complaint.  Encouragement offered. Patient given shower supplies.  q 15 safety checks in place.
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
## 2014-10-14 NOTE — ED Notes (Signed)
States "I don't no why I am here.  I don't know who committed me."  Patient is calm and cooperative.  Support offered.
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
## 2014-10-14 NOTE — ED Notes (Signed)
Patient is angry about being dispositioned to inpatient.  Denies all allegations and behaviors related to admission.  Support and encouragement.
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
## 2014-10-15 DIAGNOSIS — F312 Bipolar disorder, current episode manic severe with psychotic features: Secondary | ICD-10-CM

## 2014-10-15 NOTE — ED Notes (Signed)
Patient agitated. Responding to internal stimuli. Upset when staff ask her to bring down her volume. States to MHT "Other people were loud today, now they get to hear me".   Q 15 safety checks continue.

## 2014-10-15 NOTE — BH Assessment (Signed)
[place] Assessment Progress Note  The following facilities have been contacted to seek placement for this patient with results as noted:  Beds available, information faxed, decision pending:  [facility]   Declined:  [facility] (no IPRS beds) [facility] (no high acuity beds today) [facility] (no [facility] beds) [facility] (no [facility] I[facility] beds) [facility] (no female beds)  No beds available, but accepting referrals for future consideration:  [place]:  [facility]  Jalene Mullet, [place] Triage Specialist [phone]

## 2014-10-15 NOTE — Progress Notes (Signed)
Pt referred to [facility] as requested by patient. No beds available however will review for watilist.
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
## 2014-10-15 NOTE — ED Notes (Signed)
Pt very agitated this morning.   States"I dont feel safe in [place]"  Very verbally abusive to MD and Designer, jewellery.  After talking with nurse, she agrees that inpatient treatment would be ok but request "[place]"  No AVH observed at this time.

## 2014-10-15 NOTE — ED Notes (Signed)
Patient remains labile. Denies complaint at present.   Encouragement offered. Shower supplies provided.  Q 15 safety checks continue.
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
## 2014-10-16 MED ORDER — CARBAMAZEPINE 200 MG PO TABS
400.0000 mg | ORAL_TABLET | Freq: Two times a day (BID) | ORAL | Status: DC
  Filled 2014-10-16 (×8): qty 2

## 2014-10-16 NOTE — Consult Note (Signed)
[facility] Consult   Reason for Consult:  Labile mood, agitation, threatening people Referring Physician:  EDP Patient Identification: Emma Shepherd MRN:  [id number] Principal Diagnosis: Bipolar affective disorder, currently manic, severe, with psychotic features Diagnosis:   Patient Active Problem List   Diagnosis Date Noted  . Bipolar affective disorder, currently manic, severe, with psychotic features [F31.2] 10/15/2014    Priority: High  . 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 HTN (hypertension) [I10] 08/06/2014    Total Time spent with patient: 1 hour  Subjective:   Emma Shepherd is a 49 y.o. female patient admitted with. Recurrent major depressive disorder, severe without Psychosis, Anger problem  HPI:  AA female, 49 years old was evaluated for excessive anger. Patient was IVC by un named family member.  Patient lives at a boarding home and has been reported of aggressive behavior towards other residents.  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  Patient has not been taking her medications.   Patient reports that he moved down from [place] [id number] and was living with her sister here.  Patient reports that she moved out of her sister's home to a boarding home and that is not working well.   She also spent some time with her mother and that did not work out well too.  Patient  Reports she lives with ment that attacks her and wanting to come into her room.  Patient reports that she is divorced from her husband and that her 2 children are living with their father in [place].  Patient denied ever seeing a Psychiatrist and that she only suffers from HBP of which she takes" water pill".    Patient denies SI/HI/AVH, she  has been accepted for admission for safety and stabilization and we will be looking for admission bed.     Today  patient remained angry, agitated and yelling at providers wanting to know why she is being admitted to the hospital.  Patient has been refusing her medications.  Patient want to know who gave collateral information on her.  We will continue to offer her medications while waiting for placement.  Interval history (10/16/14): Patient seen, interviewed and chart reviewed. She remains sarcastic, hostile, uncooperative, labile and grandiose. Patient continues to make  threatening remarks to the providers, refusing medications and would not allow the providers to get collateral information on her. Patient has no insight into her mental illness and exercising poor judgment, she will benefit from inpatient admission for stabilization.   HPI Elements:   Location:  hostility, labile mood, aggression. Quality:  severe. Severity:  recurrent. Timing:  life stressor. Duration:  for years. Context:  after divorce from husband.  Past Medical History:  Past Medical History  Diagnosis Date  . Anxiety   . Obesity   . Hypertension    History reviewed. No pertinent past surgical history. Family History: History reviewed. No pertinent family history. Social History:  History  Alcohol Use  . Yes    Comment: social     History  Drug Use No    History   Social History  . Marital Status: Divorced    Spouse Name: N/A  . Number of Children: N/A  . Years of Education: N/A   Social History Main Topics  . Smoking status: Never Smoker   . Smokeless tobacco: Not on file  . Alcohol Use: Yes  Comment: social  . Drug Use: No  . Sexual Activity: Yes    Birth Control/ Protection: None   Other Topics Concern  . None   Social History Narrative   Additional Social History:    Pain Medications: No medications Prescriptions: No medications Over the Counter: None History of alcohol / drug use?: No history of alcohol / drug abuse                     Allergies:  No Known Allergies  Labs:  No  results found for this or any previous visit (from the past 48 hour(s)).  Vitals: Blood pressure 145/96, pulse 69, temperature 97.1 F (36.2 C), temperature source Oral, resp. rate 16, height 5\' 4"  (1.626 m), SpO2 99 %.  Risk to Self: Suicidal Ideation: No Suicidal Intent: No Is patient at risk for suicide?: No Suicidal Plan?: No Access to Means: No What has been your use of drugs/alcohol within the last 12 months?: Some ETOH use <1x/M How many times?: 0 Other Self Harm Risks: None Triggers for Past Attempts: None known Intentional Self Injurious Behavior: None Risk to Others: Homicidal Ideation: No Thoughts of Harm to Others: No Current Homicidal Intent: No Current Homicidal Plan: No Access to Homicidal Means: No Identified Victim: No one History of harm to others?: No Assessment of Violence: None Noted Violent Behavior Description: None Does patient have access to weapons?: No Criminal Charges Pending?: Yes Describe Pending Criminal Charges: Identity theft Does patient have a court date: Yes Court Date: 10/14/14 Prior Inpatient Therapy: Prior Inpatient Therapy: Yes Prior Therapy Dates: July '13 and Dec '13 Prior Therapy Facilty/Provider(s[facility] [place] Reason for Treatment: [facility] "[facility]" Prior Outpatient Therapy: Prior Outpatient Therapy: Yes Prior Therapy Dates: One year to current Prior Therapy Facilty/Provider(s): [facility] Reason for Treatment: counseling Does patient have an ACCT team?: No Does patient have Intensive In-House Services?  : No Does patient have [facility] services? : No Does patient have P4CC services?: No  Current Facility-Administered Medications  Medication Dose Route Frequency Provider Last Rate Last Dose  . carbamazepine (TEGRETOL) tablet 400 mg  400 mg Oral BID PC Delonda Coley      . traZODone (DESYREL) tablet 100 mg  100 mg Oral QHS PRN [place] Oborn       Current  Outpatient Prescriptions  Medication Sig Dispense Refill  . hydrochlorothiazide (HYDRODIURIL) 25 MG tablet Take 1 tablet (25 mg total) by mouth daily. (Patient not taking: Reported on 08/18/2014) 30 tablet 3  . naproxen (NAPROSYN) 500 MG tablet Take 1 tablet (500 mg total) by mouth 2 (two) times daily. (Patient not taking: Reported on 08/18/2014) 30 tablet 0    Musculoskeletal: Strength & Muscle Tone: within normal limits Gait & Station: normal Patient leans: N/A  Psychiatric Specialty Exam: Physical Exam  ROS  Blood pressure 145/96, pulse 69, temperature 97.1 F (36.2 C), temperature source Oral, resp. rate 16, height 5\' 4"  (1.626 m), SpO2 99 %.There is no weight on file to calculate BMI.  General Appearance: Casual and Disheveled  Eye Contact::  Good  Speech:  Clear and Coherent and Normal Rate  Volume:  Normal  Mood:  Anxious and Depressed  Affect:  Congruent, Depressed and Flat  Thought Process:  Coherent, Goal Directed and Intact  Orientation:  Full (Time, Place, and Person)  Thought Content:  WDL  Suicidal Thoughts:  No  Homicidal Thoughts:  No  Memory:  Immediate;  Good Recent;   Good Remote;   Good  Judgement:  Impaired  Insight:  Shallow  Psychomotor Activity:  Normal  Concentration:  Fair  Recall:  NA  Fund of Knowledge:Poor  Language: Good  Akathisia:  NA  Handed:  Right  AIMS (if indicated):     Assets:  Desire for Improvement  ADL's:  Impaired  Cognition: WNL  Sleep:      Medical Decision Making: Review of Psycho-Social Stressors (1) and Established Problem, Worsening (2)  Plan: Continue offering medications while we wait for placement. Increase Tegretol to 400 mg po bid for mood stabilization and continue Trazodone 100 mg po at bed time for sleep.    Disposition: Admit to inpatient Psychiatric hospital  Corena Pilgrim, MD 10/16/2014 11:14 AM

## 2014-10-16 NOTE — BH Assessment (Signed)
Central Assessment Progress Note  The following facilities have been contacted to seek placement for this patient with results as noted:  Beds available, information sent, decision pending:  [place]:  [facility], [place] Triage Specialist [phone]

## 2014-10-16 NOTE — BHH Counselor (Signed)
Pending review with Marcus for possible placement.

## 2014-10-16 NOTE — Consult Note (Signed)
[facility] Consult   Reason for Consult:  [place] Referring Physician:  EDP Patient Identification: Emma Shepherd MRN:  [id number] Principal Diagnosis: Bipolar affective disorder, currently manic, severe, with psychotic features Diagnosis:   Patient Active Problem List   Diagnosis Date Noted  . Bipolar affective disorder, currently manic, severe, with psychotic features [F31.2] 10/15/2014    Priority: High  . HTN (hypertension) [I10] 08/06/2014    Total Time spent with patient: 30 minutes  Subjective:   Emma Shepherd is a 49 y.o. female patient admitted with psychosis.  HPI:  The patient has been diagnosed with bipolar disorder but has not been taking her prescribed Lithium.  She is clearly responding to internal stimuli but denies it.  Prior to admission, she was threatening her neighbors.  Adelayde denies suicidal/homicidal ideations but is also very angry on assessment.  Hospitalization will be needed. HPI Elements:   Location:  generalized. Quality:  acute. Severity:  severe. Timing:  constant. Duration:  past week or two. Context:  stressors, not taking her medications.  Past Medical History:  Past Medical History  Diagnosis Date  . Anxiety   . Obesity   . Hypertension    History reviewed. No pertinent past surgical history. Family History: History reviewed. No pertinent family history. Social History:  History  Alcohol Use  . Yes    Comment: social     History  Drug Use No    History   Social History  . Marital Status: Divorced    Spouse Name: N/A  . Number of Children: N/A  . Years of Education: N/A   Social History Main Topics  . Smoking status: Never Smoker   . Smokeless tobacco: Not on file  . Alcohol Use: Yes     Comment: social  . Drug Use: No  . Sexual Activity: Yes    Birth Control/ Protection: None   Other Topics Concern  . None   Social History Narrative   Additional Social History:    Pain Medications: No  medications Prescriptions: No medications Over the Counter: None History of alcohol / drug use?: No history of alcohol / drug abuse                     Allergies:  No Known Allergies  Labs: No results found for this or any previous visit (from the past 48 hour(s)).  Vitals: Blood pressure 145/96, pulse 69, temperature 97.1 F (36.2 C), temperature source Oral, resp. rate 16, height 5\' 4"  (1.626 m), SpO2 99 %.  Risk to Self: Suicidal Ideation: No Suicidal Intent: No Is patient at risk for suicide?: No Suicidal Plan?: No Access to Means: No What has been your use of drugs/alcohol within the last 12 months?: Some ETOH use <1x/M How many times?: 0 Other Self Harm Risks: None Triggers for Past Attempts: None known Intentional Self Injurious Behavior: None Risk to Others: Homicidal Ideation: No Thoughts of Harm to Others: No Current Homicidal Intent: No Current Homicidal Plan: No Access to Homicidal Means: No Identified Victim: No one History of harm to others?: No Assessment of Violence: None Noted Violent Behavior Description: None Does patient have access to weapons?: No Criminal Charges Pending?: Yes Describe Pending Criminal Charges: Identity theft Does patient have a court date: Yes Court Date: 10/14/14 Prior Inpatient Therapy: Prior Inpatient Therapy: Yes Prior Therapy Dates: July '13 and Dec '13 Prior Therapy Facilty/Provider(s[facility] [place] Reason for Treatment: [facility] called "[facility]" Prior Outpatient  Therapy: Prior Outpatient Therapy: Yes Prior Therapy Dates: One year to current Prior Therapy Facilty/Provider(s): [facility] Reason for Treatment: counseling Does patient have an ACCT team?: No Does patient have Intensive In-House Services?  : No Does patient have [facility] services? : No Does patient have P4CC services?: No  Current Facility-Administered Medications  Medication Dose Route  Frequency Provider Last Rate Last Dose  . carbamazepine (TEGRETOL) tablet 400 mg  400 mg Oral BID PC Hanya Guerin      . traZODone (DESYREL) tablet 100 mg  100 mg Oral QHS PRN Tangie Stay       Current Outpatient Prescriptions  Medication Sig Dispense Refill  . hydrochlorothiazide (HYDRODIURIL) 25 MG tablet Take 1 tablet (25 mg total) by mouth daily. (Patient not taking: Reported on 08/18/2014) 30 tablet 3  . naproxen (NAPROSYN) 500 MG tablet Take 1 tablet (500 mg total) by mouth 2 (two) times daily. (Patient not taking: Reported on 08/18/2014) 30 tablet 0    Musculoskeletal: Strength & Muscle Tone: within normal limits Gait & Station: normal Patient leans: N/A  Psychiatric Specialty Exam: Physical Exam  Review of Systems  Constitutional: Negative.   HENT: Negative.   Eyes: Negative.   Respiratory: Negative.   Cardiovascular: Negative.   Gastrointestinal: Negative.   Genitourinary: Negative.   Musculoskeletal: Negative.   Skin: Negative.   Neurological: Negative.   Endo/Heme/Allergies: Negative.   Psychiatric/Behavioral: Positive for hallucinations.    Blood pressure 145/96, pulse 69, temperature 97.1 F (36.2 C), temperature source Oral, resp. rate 16, height 5\' 4"  (1.626 m), SpO2 99 %.There is no weight on file to calculate BMI.  General Appearance: Casual  Eye Contact::  Fair  Speech:  Normal Rate  Volume:  Normal  Mood:  Angry  Affect:  Flat  Thought Process:  Coherent  Orientation:  Full (Time, Place, and Person)  Thought Content:  Hallucinations: Auditory Visual and Paranoid Ideation  Suicidal Thoughts:  Yes.  without intent/plan  Homicidal Thoughts:  Yes.  without intent/plan  Memory:  Immediate;   Fair Recent;   Fair Remote;   Fair  Judgement:  Impaired  Insight:  Lacking  Psychomotor Activity:  Increased  Concentration:  Fair  Recall:  [employer] of Knowledge:Fair  Language: Fair  Akathisia:  No  Handed:  Right  AIMS (if indicated):     Assets:   Housing Leisure Time Physical Health Resilience Social Support  ADL's:  Intact  Cognition: WNL  Sleep:      Medical Decision Making: Review of Psycho-Social Stressors (1), Review or order clinical lab tests (1) and Review of Medication Regimen & Side Effects (2)  Treatment Plan Summary: Daily contact with patient to assess and evaluate symptoms and progress in treatment, Medication management and Plan admit to inpatient psychiatry for stabilization  Plan:  Recommend psychiatric Inpatient admission when medically cleared. Disposition: Johny Sax, PMH-NP 10/16/2014 12:17 PM Patient seen face-to-face for psychiatric evaluation, chart reviewed and case discussed with the physician extender and developed treatment plan. Reviewed the information documented and agree with the treatment plan. Corena Pilgrim, MD

## 2014-10-17 DIAGNOSIS — R45850 Homicidal ideations: Secondary | ICD-10-CM

## 2014-10-17 DIAGNOSIS — Z9114 Patient's other noncompliance with medication regimen: Secondary | ICD-10-CM

## 2014-10-17 NOTE — Progress Notes (Signed)
D Pt. Denies SI and HI, no complaints of pain or discomfort noted.  A Writer offered support and encouragement,    R Pt. Continues to be upset that she is in the hospital, denying there is a reason for her to be here, blaming others.  Pt. Has up to this point been calmer and more accepting of the situation.  Pt. Remains safe on the unit.

## 2014-10-17 NOTE — BHH Counselor (Signed)
[facility] Assessment Progress Note  Referrals faxed to [facility], [place], [place], [place]. Verified with [facility] that they are still considering patient's referral sent yesterday.     Kenna Gilbert. Lovena Le, [place], [place] Disposition Counselor

## 2014-10-17 NOTE — Progress Notes (Signed)
No appropriate bed available at [facility]. TTS to continue to look for placement. Clayborne Dana, RN

## 2014-10-17 NOTE — Consult Note (Signed)
[facility] Psychiatry Consult   Reason for Consult:  Aggressive behaviors Referring Physician:  EDP Patient Identification: Emma Shepherd MRN:  [id number] Principal Diagnosis: Bipolar affective disorder, currently manic, severe, with psychotic features Diagnosis:   Patient Active Problem List   Diagnosis Date Noted  . Bipolar affective disorder, currently manic, severe, with psychotic features [F31.2] 10/15/2014    Priority: High  . HTN (hypertension) [I10] 08/06/2014    Total Time spent with patient: 30 minutes  Subjective:   Emma Shepherd is a 49 y.o. female patient admitted with bipolar disorder with exacerbation.  HPI:  The patient remains irritable and in denial of any issues.  She has had threatening behaviors to the other boarders in her boarding house.  Her family had committed for volatile behaviors.  She goes to [facility] for her psychiatric care but does not take her Lithium because she feels she does not have any issues.  Marvelene reports suing many people and having multiple court dates.  She states, "I am an angry black woman." Refusing to take her medications in the ED. HPI Elements:   Location:  generalized. Quality:  acute. Severity:  severe. Timing:  constant. Duration:  couple of weeks. due to noncompliance of medications and stressors   Past Medical History:  Past Medical History  Diagnosis Date  . Anxiety   . Obesity   . Hypertension    History reviewed. No pertinent past surgical history. Family History: History reviewed. No pertinent family history. Social History:  History  Alcohol Use  . Yes    Comment: social     History  Drug Use No    History   Social History  . Marital Status: Divorced    Spouse Name: N/A  . Number of Children: N/A  . Years of Education: N/A   Social History Main Topics  . Smoking status: Never Smoker   . Smokeless tobacco: Not on file  . Alcohol Use: Yes     Comment: social  . Drug Use: No  .  Sexual Activity: Yes    Birth Control/ Protection: None   Other Topics Concern  . None   Social History Narrative   Additional Social History:    Pain Medications: No medications Prescriptions: No medications Over the Counter: None History of alcohol / drug use?: No history of alcohol / drug abuse                     Allergies:  No Known Allergies  Labs: No results found for this or any previous visit (from the past 48 hour(s)).  Vitals: Blood pressure 132/60, pulse 64, temperature 97.8 F (36.6 C), temperature source Oral, resp. rate 16, height 5\' 4"  (1.626 m), SpO2 98 %.  Risk to Self: Suicidal Ideation: No Suicidal Intent: No Is patient at risk for suicide?: No Suicidal Plan?: No Access to Means: No What has been your use of drugs/alcohol within the last 12 months?: Some ETOH use <1x/M How many times?: 0 Other Self Harm Risks: None Triggers for Past Attempts: None known Intentional Self Injurious Behavior: None Risk to Others: Homicidal Ideation: No Thoughts of Harm to Others: No Current Homicidal Intent: No Current Homicidal Plan: No Access to Homicidal Means: No Identified Victim: No one History of harm to others?: No Assessment of Violence: None Noted Violent Behavior Description: None Does patient have access to weapons?: No Criminal Charges Pending?: Yes Describe Pending Criminal Charges: Identity theft Does patient have a court date: Yes Court  Date: 10/14/14 Prior Inpatient Therapy: Prior Inpatient Therapy: Yes Prior Therapy Dates: July '13 and Dec '13 Prior Therapy Facilty/Provider(s): [facility] [place] Reason for Treatment: [facility] called "[facility]" Prior Outpatient Therapy: Prior Outpatient Therapy: Yes Prior Therapy Dates: One year to current Prior Therapy Facilty/Provider(s): [facility] Reason for Treatment: counseling Does patient have an ACCT team?: No Does patient have Intensive  In-House Services?  : No Does patient have [facility] services? : No Does patient have P4CC services?: No  Current Facility-Administered Medications  Medication Dose Route Frequency Provider Last Rate Last Dose  . carbamazepine (TEGRETOL) tablet 400 mg  400 mg Oral BID PC Juwann Sherk   400 mg at 10/16/14 1729  . traZODone (DESYREL) tablet 100 mg  100 mg Oral QHS PRN Kurt Azimi       Current Outpatient Prescriptions  Medication Sig Dispense Refill  . hydrochlorothiazide (HYDRODIURIL) 25 MG tablet Take 1 tablet (25 mg total) by mouth daily. (Patient not taking: Reported on 08/18/2014) 30 tablet 3  . naproxen (NAPROSYN) 500 MG tablet Take 1 tablet (500 mg total) by mouth 2 (two) times daily. (Patient not taking: Reported on 08/18/2014) 30 tablet 0    Musculoskeletal: Strength & Muscle Tone: within normal limits Gait & Station: normal Patient leans: N/A  Psychiatric Specialty Exam: Physical Exam  Review of Systems  Constitutional: Negative.   HENT: Negative.   Eyes: Negative.   Respiratory: Negative.   Cardiovascular: Negative.   Gastrointestinal: Negative.   Genitourinary: Negative.   Musculoskeletal: Negative.   Skin: Negative.   Neurological: Negative.   Endo/Heme/Allergies: Negative.   Psychiatric/Behavioral: The patient is nervous/anxious.        Aggression, threats to others     Blood pressure 132/60, pulse 64, temperature 97.8 F (36.6 C), temperature source Oral, resp. rate 16, height 5\' 4"  (1.626 m), SpO2 98 %.There is no weight on file to calculate BMI.  General Appearance: Casual  Eye Contact::  Good  Speech:  Normal Rate  Volume:  Normal  Mood:  Angry and Irritable  Affect:  Blunt  Thought Process:  Coherent  Orientation:  Full (Time, Place, and Person)  Thought Content:  Rumination  Suicidal Thoughts:  No  Homicidal Thoughts:  Yes.  without intent/plan  Memory:  Immediate;   Fair Recent;   Fair Remote;   Fair  Judgement:  Impaired  Insight:  Lacking   Psychomotor Activity:  Decreased  Concentration:  Fair  Recall:  [employer] of Knowledge:Fair  Language: Good  Akathisia:  No  Handed:  Right  AIMS (if indicated):     Assets:  Leisure Time Physical Health Resilience  ADL's:  Intact  Cognition: WNL  Sleep:      Medical Decision Making: Review of Psycho-Social Stressors (1), Review or order clinical lab tests (1) and Review of Medication Regimen & Side Effects (2)  Treatment Plan Summary: Daily contact with patient to assess and evaluate symptoms and progress in treatment, Medication management and Plan admit to inpatient hospitalization for stabilization  Plan:  Recommend psychiatric Inpatient admission when medically cleared. Disposition: Johny Sax, PMH-NP 10/17/2014 1:46 PM Patient seen face-to-face for psychiatric evaluation, chart reviewed and case discussed with the physician extender and developed treatment plan. Reviewed the information documented and agree with the treatment plan. Corena Pilgrim, MD

## 2014-10-18 NOTE — Consult Note (Signed)
[facility] Consult   Reason for Consult:  Aggressive behaviors Referring Physician:  EDP Patient Identification: Emma Shepherd MRN:  [id number] Principal Diagnosis: Bipolar affective disorder, currently manic, severe, with psychotic features Diagnosis:   Patient Active Problem List   Diagnosis Date Noted  . Bipolar affective disorder, currently manic, severe, with psychotic features [F31.2] 10/15/2014    Priority: High  . HTN (hypertension) [I10] 08/06/2014    Total Time spent with patient: 30 minutes  Subjective:   Emma Shepherd is a 49 y.o. female patient admitted with bipolar disorder with exacerbation.  HPI:  The patient was not irritable on assessment today and continues to deny any issues.  She denies suicidal/homicidal ideations, hallucinations, and alcohol/drug abuse.  Providers will re-evaluate in the am HPI Elements:   Location:  generalized. Quality:  acute. Severity:  severe. Timing:  constant. Duration:  couple of weeks. due to noncompliance of medications and stressors   Past Medical History:  Past Medical History  Diagnosis Date  . Anxiety   . Obesity   . Hypertension    History reviewed. No pertinent past surgical history. Family History: History reviewed. No pertinent family history. Social History:  History  Alcohol Use  . Yes    Comment: social     History  Drug Use No    History   Social History  . Marital Status: Divorced    Spouse Name: N/A  . Number of Children: N/A  . Years of Education: N/A   Social History Main Topics  . Smoking status: Never Smoker   . Smokeless tobacco: Not on file  . Alcohol Use: Yes     Comment: social  . Drug Use: No  . Sexual Activity: Yes    Birth Control/ Protection: None   Other Topics Concern  . None   Social History Narrative   Additional Social History:    Pain Medications: No medications Prescriptions: No medications Over the Counter: None History of alcohol / drug  use?: No history of alcohol / drug abuse                     Allergies:  No Known Allergies  Labs: No results found for this or any previous visit (from the past 48 hour(s)).  Vitals: Blood pressure 122/77, pulse 77, temperature 98.5 F (36.9 C), temperature source Oral, resp. rate 18, height 5\' 4"  (1.626 m), SpO2 99 %.  Risk to Self: Suicidal Ideation: No Suicidal Intent: No Is patient at risk for suicide?: No Suicidal Plan?: No Access to Means: No What has been your use of drugs/alcohol within the last 12 months?: Some ETOH use <1x/M How many times?: 0 Other Self Harm Risks: None Triggers for Past Attempts: None known Intentional Self Injurious Behavior: None Risk to Others: Homicidal Ideation: No Thoughts of Harm to Others: No Current Homicidal Intent: No Current Homicidal Plan: No Access to Homicidal Means: No Identified Victim: No one History of harm to others?: No Assessment of Violence: None Noted Violent Behavior Description: None Does patient have access to weapons?: No Criminal Charges Pending?: Yes Describe Pending Criminal Charges: Identity theft Does patient have a court date: Yes Court Date: 10/14/14 Prior Inpatient Therapy: Prior Inpatient Therapy: Yes Prior Therapy Dates: July '13 and Dec '13 Prior Therapy Facilty/Provider(s[facility] [place] Reason for Treatment: [facility]" Prior Outpatient Therapy: Prior Outpatient Therapy: Yes Prior Therapy Dates: One year to current Prior Therapy Facilty/Provider(s): [facility]  [place] Reason for Treatment: counseling Does patient have an ACCT team?: No Does patient have Intensive In-House Services?  : No Does patient have [facility] services? : No Does patient have P4CC services?: No  Current Facility-Administered Medications  Medication Dose Route Frequency Provider Last Rate Last Dose  . carbamazepine (TEGRETOL) tablet 400 mg  400 mg Oral BID PC  Allani Reber   400 mg at 10/16/14 1729  . traZODone (DESYREL) tablet 100 mg  100 mg Oral QHS PRN Greycen Felter       Current Outpatient Prescriptions  Medication Sig Dispense Refill  . hydrochlorothiazide (HYDRODIURIL) 25 MG tablet Take 1 tablet (25 mg total) by mouth daily. (Patient not taking: Reported on 08/18/2014) 30 tablet 3  . naproxen (NAPROSYN) 500 MG tablet Take 1 tablet (500 mg total) by mouth 2 (two) times daily. (Patient not taking: Reported on 08/18/2014) 30 tablet 0    Musculoskeletal: Strength & Muscle Tone: within normal limits Gait & Station: normal Patient leans: N/A  Psychiatric Specialty Exam: Physical Exam  Review of Systems  Constitutional: Negative.   HENT: Negative.   Eyes: Negative.   Respiratory: Negative.   Cardiovascular: Negative.   Gastrointestinal: Negative.   Genitourinary: Negative.   Musculoskeletal: Negative.   Skin: Negative.   Neurological: Negative.   Endo/Heme/Allergies: Negative.   Psychiatric/Behavioral: The patient is nervous/anxious.     Blood pressure 122/77, pulse 77, temperature 98.5 F (36.9 C), temperature source Oral, resp. rate 18, height 5\' 4"  (1.626 m), SpO2 99 %.There is no weight on file to calculate BMI.  General Appearance: Casual  Eye Contact::  Good  Speech:  Normal Rate  Volume:  Normal  Mood:  Euthymic  Affect:  Congruent  Thought Process:  Coherent  Orientation:  Full (Time, Place, and Person)  Thought Content:  Rumination  Suicidal Thoughts:  No  Homicidal Thoughts:  No  Memory:  Immediate;   Fair Recent;   Fair Remote;   Fair  Judgement:  Impaired  Insight:  Lacking  Psychomotor Activity:  Decreased  Concentration:  Fair  Recall:  [employer] of Knowledge:Fair  Language: Good  Akathisia:  No  Handed:  Right  AIMS (if indicated):     Assets:  Leisure Time Physical Health Resilience  ADL's:  Intact  Cognition: WNL  Sleep:      Medical Decision Making: Review of Psycho-Social Stressors (1),  Review or order clinical lab tests (1) and Review of Medication Regimen & Side Effects (2)  Treatment Plan Summary: Daily contact with patient to assess and evaluate symptoms and progress in treatment, Medication management and Plan admit to inpatient hospitalization for stabilization  Plan:  Recommend psychiatric Inpatient admission when medically cleared. Unless she remains stable for 24 hours, re-evaluate in the am. Disposition: Re-evaluate in the am after obtaining family information  Waylan Boga, [place] 10/18/2014 1:03 PM Patient seen face-to-face for psychiatric evaluation, chart reviewed and case discussed with the physician extender and developed treatment plan. Reviewed the information documented and agree with the treatment plan. Corena Pilgrim, MD

## 2014-10-18 NOTE — ED Notes (Signed)
Emma Shepherd has been pleasant this a.m. She refused scheduled Tegretol and said she doesn't need to be on meds. She denied SI/HI/AVH. Urged her to Warden/ranger with needs/concerns. Will continue to monitor.

## 2014-10-18 NOTE — ED Notes (Signed)
Emma Shepherd refused Tegretol and says she doesn't need it. Pt calm and cooperative at present.

## 2014-10-18 NOTE — ED Notes (Signed)
Acuity moderate Pt is alert and cooperative in room but is refusing psychotropic medications.

## 2014-10-18 NOTE — Progress Notes (Signed)
D  Pt.  Denies SI and HI, no complaints of pain or discomfort noted.  A Writer offered support and encourgement.  R Pt. Denies a reason for her admittance to Psych ED. Pt. Feels everyone else is to blame.  Pt. Has remained calm and cooperative this pm.  Pt. Remains safe on the unit.

## 2014-10-18 NOTE — ED Notes (Signed)
Acuity: Low Emma Shepherd remains calm and cooperative, if occasionally irritable.

## 2014-10-19 NOTE — BHH Suicide Risk Assessment (Deleted)
Suicide Risk Assessment  Discharge Assessment   [facility] Discharge Suicide Risk Assessment   Demographic Factors:  Divorced or widowed, Low socioeconomic status, Living alone and Unemployed  Total Time spent with patient: 20 minutes  Musculoskeletal: Strength & Muscle Tone: within normal limits Gait & Station: normal Patient leans: N/A  Psychiatric Specialty Exam:     Blood pressure 101/67, pulse 82, temperature 97.9 F (36.6 C), temperature source Oral, resp. rate 18, height 5\' 4"  (1.626 m), SpO2 100 %.There is no weight on file to calculate BMI.  General Appearance: Casual  Eye Contact:: Good  Speech: Normal Rate  Volume: Normal  Mood: Euthymic  Affect: Congruent  Thought Process: Coherent  Orientation: Full (Time, Place, and Person)  Thought Content: WDL  Suicidal Thoughts: No  Homicidal Thoughts: No  Memory: Immediate; Fair Recent; Fair Remote; Fair  Judgement: Poor  Insight: Lacking  Psychomotor Activity: Normal  Concentration: Fair  Recall: [employer] of Knowledge:Fair  Language: Good  Akathisia: No  Handed: Right  AIMS (if indicated):    Assets: Leisure Time Physical Health Resilience  ADL's: Intact  Cognition: WNL  Sleep:               Has this patient used any form of tobacco in the last 30 days? (Cigarettes, Smokeless Tobacco, Cigars, and/or Pipes) N/A  Mental Status Per Nursing Assessment::   On Admission:     Current Mental Status by Physician: NA  Loss Factors: NA  Historical Factors: NA  Risk Reduction Factors:   Responsible for children under 74 years of age, Religious beliefs about death and Positive coping skills or problem solving skills  Continued Clinical Symptoms:  Bipolar Disorder:   Depressive phase Depression:   Insomnia  Cognitive Features That Contribute To Risk:  Closed-mindedness and Polarized thinking    Suicide Risk:  Minimal: No identifiable suicidal  ideation.  Patients presenting with no risk factors but with morbid ruminations; may be classified as minimal risk based on the severity of the depressive symptoms  Principal Problem: Bipolar affective disorder, currently manic, severe, with psychotic features Discharge Diagnoses:  Patient Active Problem List   Diagnosis Date Noted  . Bipolar affective disorder, currently manic, severe, with psychotic features [F31.2] 10/15/2014  . HTN (hypertension) [I10] 08/06/2014      Plan Of Care/Follow-up recommendations:  Activity:  as tolerated Diet:  regular  Is patient on multiple antipsychotic therapies at discharge:  No   Has Patient had three or more failed trials of antipsychotic monotherapy by history:  No  Recommended Plan for Multiple Antipsychotic Therapies: NA    Shjon Lizarraga C  PMHNP-BC 10/19/2014, 4:15 PM

## 2014-10-19 NOTE — BHH Suicide Risk Assessment (Cosign Needed)
Suicide Risk Assessment  Discharge Assessment   [facility] Discharge Suicide Risk Assessment   Demographic Factors:  Living alone and Unemployed  Total Time spent with patient: 20 minutes  Musculoskeletal: Strength & Muscle Tone: within normal limits Gait & Station: normal Patient leans: N/A  Psychiatric Specialty Exam:     Blood pressure 101/67, pulse 76, temperature 97.8 F (36.6 C), temperature source Oral, resp. rate 18, height 5\' 4"  (1.626 m), SpO2 96 %.There is no weight on file to calculate BMI.  General Appearance: Casual  Eye Contact::  Good  Speech:  Clear and Coherent and Normal Rate409  Volume:  Normal  Mood:  Angry  Affect:  Congruent  Thought Process:  Coherent, Goal Directed and Intact  Orientation:  Full (Time, Place, and Person)  Thought Content:  WDL  Suicidal Thoughts:  No  Homicidal Thoughts:  No  Memory:  Immediate;   Good Recent;   Good Remote;   Good  Judgement:  Fair  Insight:  Fair  Psychomotor Activity:  Normal  Concentration:  Fair  Recall:  NA  Fund of Knowledge:Fair  Language: Good  Akathisia:  NA  Handed:  Right  AIMS (if indicated):     Assets:  Desire for Improvement  Sleep:     Cognition: WNL  ADL's:  Intact      Has this patient used any form of tobacco in the last 30 days? (Cigarettes, Smokeless Tobacco, Cigars, and/or Pipes) N/A  Mental Status Per Nursing Assessment::   On Admission:     Current Mental Status by Physician: NA  Loss Factors: NA  Historical Factors: NA  Risk Reduction Factors:   MOVING BACK TO HER BOARDING HOME  Continued Clinical Symptoms:  Bipolar Disorder:   Depressive phase Depression:   Insomnia  Cognitive Features That Contribute To Risk:  Closed-mindedness and Polarized thinking    Suicide Risk:  Minimal: No identifiable suicidal ideation.  Patients presenting with no risk factors but with morbid ruminations; may be classified as minimal risk based on the severity of the depressive  symptoms  Principal Problem: Bipolar affective disorder, currently manic, severe, with psychotic features Discharge Diagnoses:  Patient Active Problem List   Diagnosis Date Noted  . Bipolar affective disorder, currently manic, severe, with psychotic features [F31.2] 10/15/2014  . HTN (hypertension) [I10] 08/06/2014      Plan Of Care/Follow-up recommendations:  Activity:  AS TOLERATED Diet:  REGULAR  Is patient on multiple antipsychotic therapies at discharge:  No   Has Patient had three or more failed trials of antipsychotic monotherapy by history:  No  Recommended Plan for Multiple Antipsychotic Therapies: NA    Kimetha Trulson C   PMHNP-BC 10/19/2014, 10:48 AM

## 2014-10-19 NOTE — BH Assessment (Signed)
[place] [facility] Assessment Progress Note  Per Corena Pilgrim, MD this pt no longer requires psychiatric hospitalization.  Pt's IVC is to be rescinded and she is to be discharged from [facility].  Pt receives outpatient treatment through [facility], and discharge instructions will advise her to follow up with them.  Petition has been rescinded.  Pt's nurse has been notified.  Jalene Mullet, MA Triage Specialist [phone]

## 2014-10-19 NOTE — Progress Notes (Signed)
Per psychiatrist, patient psychiatrically stable for discharge home. Pt denies SI/HI/Ah/VH. Patient however refusing medications. Pt sister and mother have called concerned about patient however pt is refusing to sign consent to release information. Pt plans to f/u with [facility] the [place].   Belia Heman, [place] Work  [employer] [phone]

## 2014-10-19 NOTE — ED Notes (Signed)
Patient discharged to home.  All belongings returned and patient was given a bus pass and a bag lunch.  Denies thoughts of harm to self or others.

## 2014-10-19 NOTE — Consult Note (Signed)
[facility] Consult   Reason for Consult:  Aggressive behaviors Referring Physician:  EDP Patient Identification: Emma Shepherd MRN:  [id number] Principal Diagnosis: Bipolar affective disorder, currently manic, severe, with psychotic features Diagnosis:   Patient Active Problem List   Diagnosis Date Noted  . Bipolar affective disorder, currently manic, severe, with psychotic features [F31.2] 10/15/2014  . HTN (hypertension) [I10] 08/06/2014    Total Time spent with patient: 30 minutes  Subjective:   Emma Shepherd is a 49 y.o. female patient admitted with bipolar disorder with exacerbation.  HPI:  The patient was not irritable on assessment today and continues to deny any issues.  She denies suicidal/homicidal ideations, hallucinations, and alcohol/drug abuse.  Providers will re-evaluate in the am  Patient continues to decline medication stating that nothing is wrong with her.  Patient did not take any medications while staying in the unit.  Patient vehemently denied SI/HI/AVH.  She is discharged home now.  HPI Elements:   Location:  generalized. Quality:  acute. Severity:  severe. Timing:  constant. Duration:  couple of weeks. due to noncompliance of medications and stressors   Past Medical History:  Past Medical History  Diagnosis Date  . Anxiety   . Obesity   . Hypertension    History reviewed. No pertinent past surgical history. Family History: History reviewed. No pertinent family history. Social History:  History  Alcohol Use  . Yes    Comment: social     History  Drug Use No    History   Social History  . Marital Status: Divorced    Spouse Name: N/A  . Number of Children: N/A  . Years of Education: N/A   Social History Main Topics  . Smoking status: Never Smoker   . Smokeless tobacco: Not on file  . Alcohol Use: Yes     Comment: social  . Drug Use: No  . Sexual Activity: Yes    Birth Control/ Protection: None   Other Topics  Concern  . None   Social History Narrative   Additional Social History:    Pain Medications: No medications Prescriptions: No medications Over the Counter: None History of alcohol / drug use?: No history of alcohol / drug abuse   Allergies:  No Known Allergies  Labs: No results found for this or any previous visit (from the past 48 hour(s)).  Vitals: Blood pressure 101/67, pulse 76, temperature 97.8 F (36.6 C), temperature source Oral, resp. rate 18, height 5\' 4"  (1.626 m), SpO2 96 %.  Risk to Self: Suicidal Ideation: No Suicidal Intent: No Is patient at risk for suicide?: No Suicidal Plan?: No Access to Means: No What has been your use of drugs/alcohol within the last 12 months?: Some ETOH use <1x/M How many times?: 0 Other Self Harm Risks: None Triggers for Past Attempts: None known Intentional Self Injurious Behavior: None Risk to Others: Homicidal Ideation: No Thoughts of Harm to Others: No Current Homicidal Intent: No Current Homicidal Plan: No Access to Homicidal Means: No Identified Victim: No one History of harm to others?: No Assessment of Violence: None Noted Violent Behavior Description: None Does patient have access to weapons?: No Criminal Charges Pending?: Yes Describe Pending Criminal Charges: Identity theft Does patient have a court date: Yes Court Date: 10/14/14 Prior Inpatient Therapy: Prior Inpatient Therapy: Yes Prior Therapy Dates: July '13 and Dec '13 Prior Therapy Facilty/Provider(s[facility] [place] Reason for Treatment: [facility] called "[facility]" Prior Outpatient Therapy: Prior Outpatient Therapy: Yes  Prior Therapy Dates: One year to current Prior Therapy Facilty/Provider(s): [facility] [place] Reason for Treatment: counseling Does patient have an ACCT team?: No Does patient have Intensive In-House Services?  : No Does patient have [facility] services? : No Does patient have P4CC services?:  No  Current Facility-Administered Medications  Medication Dose Route Frequency Provider Last Rate Last Dose  . carbamazepine (TEGRETOL) tablet 400 mg  400 mg Oral BID PC Annastyn Silvey   400 mg at 10/16/14 1729  . traZODone (DESYREL) tablet 100 mg  100 mg Oral QHS PRN Oree Mirelez       Current Outpatient Prescriptions  Medication Sig Dispense Refill  . hydrochlorothiazide (HYDRODIURIL) 25 MG tablet Take 1 tablet (25 mg total) by mouth daily. (Patient not taking: Reported on 08/18/2014) 30 tablet 3  . naproxen (NAPROSYN) 500 MG tablet Take 1 tablet (500 mg total) by mouth 2 (two) times daily. (Patient not taking: Reported on 08/18/2014) 30 tablet 0    Musculoskeletal: Strength & Muscle Tone: within normal limits Gait & Station: normal Patient leans: N/A  Psychiatric Specialty Exam: Physical Exam  ROS  Blood pressure 101/67, pulse 76, temperature 97.8 F (36.6 C), temperature source Oral, resp. rate 18, height 5\' 4"  (1.626 m), SpO2 96 %.There is no weight on file to calculate BMI.  General Appearance: Casual  Eye Contact::  Good  Speech:  Normal Rate  Volume:  Normal  Mood:  Euthymic  Affect:  Congruent  Thought Process:  Coherent  Orientation:  Full (Time, Place, and Person)  Thought Content:  WDL  Suicidal Thoughts:  No  Homicidal Thoughts:  No  Memory:  Immediate;   Fair Recent;   Fair Remote;   Fair  Judgement:  Poor  Insight:  Lacking  Psychomotor Activity:  Normal  Concentration:  Fair  Recall:  [employer] of Knowledge:Fair  Language: Good  Akathisia:  No  Handed:  Right  AIMS (if indicated):     Assets:  Leisure Time Physical Health Resilience  ADL's:  Intact  Cognition: WNL  Sleep:      Medical Decision Making: Established Problem, Stable/Improving (1)  Disposition: Discharge home  Delfin Gant, PMHNP-BC 10/19/2014 10:53 AM Patient seen face-to-face for psychiatric evaluation, chart reviewed and case discussed with the physician extender and  developed treatment plan. Reviewed the information documented and agree with the treatment plan. Corena Pilgrim, MD

## 2014-10-19 NOTE — Discharge Instructions (Signed)
For your ongoing behavioral health needs you are advised to continue treatment with [facility] the [place]:       [facility] [place]      [address], [place] [address]      [phone]

## 2014-10-20 ENCOUNTER — Emergency Department (HOSPITAL_COMMUNITY)
Admission: EM | Admit: 2014-10-20 | Discharge: 2014-10-20 | Disposition: A | Discharge status: Home / Self Care | Payer: Self-pay | Attending: Emergency Medicine | Admitting: Emergency Medicine

## 2014-10-20 NOTE — ED Notes (Signed)
1030 Pt not in room  1045 Pt not in room or lobby  1105 Pt not in room or lobby

## 2014-12-23 ENCOUNTER — Ambulatory Visit: Payer: Self-pay | Admitting: Internal Medicine

## 2015-02-05 ENCOUNTER — Emergency Department (HOSPITAL_COMMUNITY)
Admission: EM | Admit: 2015-02-05 | Discharge: 2015-02-05 | Disposition: A | Discharge status: Home / Self Care | Payer: Self-pay | Attending: Emergency Medicine | Admitting: Emergency Medicine

## 2015-02-05 ENCOUNTER — Encounter (HOSPITAL_COMMUNITY): Payer: Self-pay | Admitting: Emergency Medicine

## 2015-02-05 DIAGNOSIS — I10 Essential (primary) hypertension: Secondary | ICD-10-CM | POA: Insufficient documentation

## 2015-02-05 DIAGNOSIS — Z3202 Encounter for pregnancy test, result negative: Secondary | ICD-10-CM | POA: Insufficient documentation

## 2015-02-05 DIAGNOSIS — Z8659 Personal history of other mental and behavioral disorders: Secondary | ICD-10-CM | POA: Insufficient documentation

## 2015-02-05 DIAGNOSIS — E669 Obesity, unspecified: Secondary | ICD-10-CM | POA: Insufficient documentation

## 2015-02-05 DIAGNOSIS — R51 Headache: Secondary | ICD-10-CM | POA: Insufficient documentation

## 2015-02-05 DIAGNOSIS — R1013 Epigastric pain: Secondary | ICD-10-CM | POA: Insufficient documentation

## 2015-02-05 DIAGNOSIS — R519 Headache, unspecified: Secondary | ICD-10-CM

## 2015-02-05 LAB — POC URINE PREG, ED: PREG TEST UR: NEGATIVE

## 2015-02-05 MED ORDER — KETOROLAC TROMETHAMINE 30 MG/ML IJ SOLN
30.0000 mg | Freq: Once | INTRAMUSCULAR | Status: AC
  Administered 2015-02-05: 30 mg via INTRAMUSCULAR
  Filled 2015-02-05: qty 1

## 2015-02-05 NOTE — ED Provider Notes (Signed)
CSN: [id number]   Arrival date & time 02/05/15 0047  History  This chart was scribed for Carmin Muskrat, MD by Altamease Oiler, ED Scribe. This patient was seen in room WA16/WA16 and the patient's care was started at 2:13 AM.  Chief Complaint  Patient presents with  . Abdominal Cramping  . Headache    HPI The history is provided by the patient. No language interpreter was used.   Emma Shepherd is a 49 y.o. female with PMHx of obesity and HTN who presents to the Emergency Department complaining of intermittent abdominal pain with onset 3 months ago. The pain is describes as cramping and rated 7/10 in severity.  Associated symptoms include headache typical of her usual headaches. She took nothing for pain PTA. Her last headache was 4 months ago. Pt denies fever, neck pain, vomiting, chest pain, SOB. She does not remember when her LNMP was and states that they are chronically irregular.   Past Medical History  Diagnosis Date  . Anxiety   . Obesity   . Hypertension     History reviewed. No pertinent past surgical history.  History reviewed. No pertinent family history.  Social History  Substance Use Topics  . Smoking status: Never Smoker   . Smokeless tobacco: None  . Alcohol Use: Yes     Comment: social     Review of Systems  Constitutional:       Per HPI, otherwise negative  HENT:       Per HPI, otherwise negative  Respiratory:       Per HPI, otherwise negative  Cardiovascular:       Per HPI, otherwise negative  Endocrine:       Negative aside from HPI  Genitourinary:       Neg aside from HPI   Musculoskeletal:       Per HPI, otherwise negative  Skin: Negative.   Neurological: Negative for syncope.   Home Medications   Prior to Admission medications   Medication Sig Start Date End Date Taking? Authorizing Provider  hydrochlorothiazide (HYDRODIURIL) 25 MG tablet Take 1 tablet (25 mg total) by mouth daily. Patient not taking: Reported on 08/18/2014 08/06/14   Lance Bosch, NP  naproxen (NAPROSYN) 500 MG tablet Take 1 tablet (500 mg total) by mouth 2 (two) times daily. Patient not taking: Reported on 08/18/2014 08/13/14   Jeannett Senior, PA-C    Allergies  Review of patient's allergies indicates no known allergies.  Triage Vitals: BP 157/122 mmHg  Pulse 78  Temp(Src) 98.8 F (37.1 C) (Oral)  Resp 20  SpO2 100%  Physical Exam  Constitutional: She is oriented to person, place, and time. She appears well-developed and well-nourished. No distress.  HENT:  Head: Normocephalic and atraumatic.  Eyes: Conjunctivae and EOM are normal.  Cardiovascular: Normal rate, regular rhythm and normal heart sounds.  Exam reveals no gallop and no friction rub.   No murmur heard. Pulmonary/Chest: Effort normal and breath sounds normal. No stridor. No respiratory distress. She has no wheezes. She has no rales.  Abdominal: She exhibits no distension.  Epigastric tenderness  Musculoskeletal: She exhibits no edema.  Neurological: She is alert and oriented to person, place, and time. No cranial nerve deficit.  Skin: Skin is warm and dry.  Psychiatric: She has a normal mood and affect.  Nursing note and vitals reviewed.   ED Course  Procedures  COORDINATION OF CARE: 2:15 AM Discussed treatment plan which includes lab work and ibuprofen with pt at bedside  and pt agreed to plan.  I reviewed the results (including imaging as performed), agree with the interpretation  On repeat exam the patient appears better.  We reviewed all findings.    MDM   I personally performed the services described in this documentation, which was scribed in my presence. The recorded information has been reviewed and is accurate.   Well-appearing female presents with concern of intermittent headache, abdominal cramping. Here the patient is awake, alert, no peritoneal findings, no evidence for meningitis or neurologic compromise. Patient has not taken any medication thus far for her  symptoms. Patient improved substantially here with ibuprofen, and with no evidence for ongoing infection, pregnancy, the patient was discharged in stable condition.   Carmin Muskrat, MD 02/05/15 [phone]

## 2015-02-05 NOTE — ED Notes (Signed)
Pt c/o headache and abd cramping x several months. Pt states its been about 4 months since last headache. Pt denies n/v/d.

## 2015-02-05 NOTE — Discharge Instructions (Signed)
As discussed, your evaluation today has been largely reassuring.  But, it is important that you monitor your condition carefully, and do not hesitate to return to the ED if you develop new, or concerning changes in your condition. ? ?Otherwise, please follow-up with your physician for appropriate ongoing care. ? ?

## 2015-03-06 ENCOUNTER — Encounter (HOSPITAL_COMMUNITY): Payer: Self-pay | Admitting: Emergency Medicine

## 2015-03-06 ENCOUNTER — Emergency Department (HOSPITAL_COMMUNITY)
Admission: EM | Admit: 2015-03-06 | Discharge: 2015-03-06 | Disposition: A | Discharge status: Home / Self Care | Payer: Self-pay | Attending: Emergency Medicine | Admitting: Emergency Medicine

## 2015-03-06 DIAGNOSIS — J069 Acute upper respiratory infection, unspecified: Secondary | ICD-10-CM | POA: Insufficient documentation

## 2015-03-06 DIAGNOSIS — R51 Headache: Secondary | ICD-10-CM

## 2015-03-06 DIAGNOSIS — G8929 Other chronic pain: Secondary | ICD-10-CM | POA: Insufficient documentation

## 2015-03-06 DIAGNOSIS — E669 Obesity, unspecified: Secondary | ICD-10-CM | POA: Insufficient documentation

## 2015-03-06 DIAGNOSIS — I10 Essential (primary) hypertension: Secondary | ICD-10-CM | POA: Insufficient documentation

## 2015-03-06 DIAGNOSIS — Z8659 Personal history of other mental and behavioral disorders: Secondary | ICD-10-CM | POA: Insufficient documentation

## 2015-03-06 DIAGNOSIS — Z79899 Other long term (current) drug therapy: Secondary | ICD-10-CM | POA: Insufficient documentation

## 2015-03-06 LAB — RAPID STREP SCREEN (MED CTR MEBANE ONLY): STREPTOCOCCUS, GROUP A SCREEN (DIRECT): NEGATIVE

## 2015-03-06 MED ORDER — IBUPROFEN 600 MG PO TABS: 600.0000 mg | ORAL_TABLET | Freq: Four times a day (QID) | ORAL | Status: DC | PRN

## 2015-03-06 MED ORDER — KETOROLAC TROMETHAMINE 60 MG/2ML IM SOLN
60.0000 mg | Freq: Once | INTRAMUSCULAR | Status: AC
  Administered 2015-03-06: 60 mg via INTRAMUSCULAR
  Filled 2015-03-06: qty 2

## 2015-03-06 MED ORDER — METHYLPREDNISOLONE SODIUM SUCC 125 MG IJ SOLR
125.0000 mg | Freq: Once | INTRAMUSCULAR | Status: AC
  Administered 2015-03-06: 125 mg via INTRAMUSCULAR
  Filled 2015-03-06: qty 2

## 2015-03-06 MED ORDER — METOCLOPRAMIDE HCL 10 MG PO TABS
10.0000 mg | ORAL_TABLET | Freq: Once | ORAL | Status: AC
  Administered 2015-03-06: 10 mg via ORAL
  Filled 2015-03-06: qty 1

## 2015-03-06 MED ORDER — OXYMETAZOLINE HCL 0.05 % NA SOLN: 1.0000 | Freq: Two times a day (BID) | NASAL | Status: DC

## 2015-03-06 MED ORDER — LORATADINE 10 MG PO TABS: 10.0000 mg | ORAL_TABLET | Freq: Every day | ORAL | Status: DC

## 2015-03-06 MED ORDER — METOCLOPRAMIDE HCL 10 MG PO TABS: 10.0000 mg | ORAL_TABLET | Freq: Four times a day (QID) | ORAL | Status: DC | PRN

## 2015-03-06 NOTE — ED Provider Notes (Signed)
CSN: [id number]     Arrival date & time 03/06/15  0007 History  By signing my name below, I, Terrance Branch, attest that this documentation has been prepared under the direction and in the presence of Julianne Rice, MD. Electronically Signed: Randa Evens, ED Scribe. 03/06/2015. 2:49 AM.     Chief Complaint  Patient presents with  . Headache   Patient is a 49 y.o. female presenting with headaches. The history is provided by the patient. No language interpreter was used.  Headache Associated symptoms: congestion and sore throat   Associated symptoms: no abdominal pain, no cough, no dizziness, no fever, no nausea, no neck pain, no neck stiffness, no numbness, no photophobia, no sinus pressure, no vomiting and no weakness    HPI Comments: Emma Shepherd is a 49 y.o. female who presents to the Emergency Department complaining of burning HA onset 12 hours PTA. Pt describes the HA as a heaviness on the left side of her head. Pt reports having sore throat and congestion as well. Pt does report that some of her roommates have had URI symptoms the past week. Pt denies any medications PTA. Denies fever, diaphoresis, nausea, vomiting, neck pain or other related symptoms.   Past Medical History  Diagnosis Date  . Anxiety   . Obesity   . Hypertension    History reviewed. No pertinent past surgical history. No family history on file. Social History  Substance Use Topics  . Smoking status: Never Smoker   . Smokeless tobacco: None  . Alcohol Use: Yes     Comment: social   OB History    No data available     Review of Systems  Constitutional: Negative for fever, chills and diaphoresis.  HENT: Positive for congestion and sore throat. Negative for sinus pressure.   Eyes: Negative for photophobia.  Respiratory: Negative for cough and shortness of breath.   Cardiovascular: Negative for chest pain.  Gastrointestinal: Negative for nausea, vomiting and abdominal pain.  Musculoskeletal:  Negative for neck pain and neck stiffness.  Skin: Negative for rash and wound.  Neurological: Positive for headaches. Negative for dizziness, syncope, weakness, light-headedness and numbness.  All other systems reviewed and are negative.     Allergies  Review of patient's allergies indicates no known allergies.  Home Medications   Prior to Admission medications   Medication Sig Start Date End Date Taking? Authorizing Provider  hydrochlorothiazide (HYDRODIURIL) 25 MG tablet Take 1 tablet (25 mg total) by mouth daily. Patient not taking: Reported on 08/18/2014 08/06/14   Lance Bosch, NP  ibuprofen (ADVIL,MOTRIN) 600 MG tablet Take 1 tablet (600 mg total) by mouth every 6 (six) hours as needed. 03/06/15   Julianne Rice, MD  loratadine (CLARITIN) 10 MG tablet Take 1 tablet (10 mg total) by mouth daily. 03/06/15   Julianne Rice, MD  metoCLOPramide (REGLAN) 10 MG tablet Take 1 tablet (10 mg total) by mouth every 6 (six) hours as needed for nausea (headache). 03/06/15   Julianne Rice, MD  naproxen (NAPROSYN) 500 MG tablet Take 1 tablet (500 mg total) by mouth 2 (two) times daily. Patient not taking: Reported on 08/18/2014 08/13/14   Tatyana Kirichenko, PA-C  oxymetazoline (AFRIN NASAL SPRAY) 0.05 % nasal spray Place 1 spray into both nostrils 2 (two) times daily. 03/06/15   Julianne Rice, MD   BP 152/103 mmHg  Pulse 98  Temp(Src) 99 F (37.2 C) (Oral)  Resp 22  SpO2 99%   Physical Exam  Constitutional: She is oriented to  person, place, and time. She appears well-developed and well-nourished. No distress.  HENT:  Head: Normocephalic and atraumatic.  Mouth/Throat: Oropharyngeal exudate present.  Bilateral tonsillar hypertrophy with exudate. Bilateral nasal mucosal edema. No sinus tenderness with percussion.  Eyes: EOM are normal. Pupils are equal, round, and reactive to light.  Neck: Normal range of motion. Neck supple.  No meningismus  Cardiovascular: Normal rate and regular  rhythm.   Pulmonary/Chest: Effort normal and breath sounds normal. No respiratory distress. She has no wheezes. She has no rales. She exhibits no tenderness.  Abdominal: Soft. Bowel sounds are normal. She exhibits no distension and no mass. There is no tenderness. There is no rebound and no guarding.  Musculoskeletal: Normal range of motion. She exhibits no edema or tenderness.  No CVA tenderness bilaterally.  Lymphadenopathy:    She has no cervical adenopathy.  Neurological: She is alert and oriented to person, place, and time.  Patient is alert and oriented x3 with clear, goal oriented speech. Patient has 5/5 motor in all extremities. Sensation is intact to light touch. Bilateral finger-to-nose is normal with no signs of dysmetria. Patient has a normal gait and walks without assistance.  Skin: Skin is warm and dry. No rash noted. No erythema.  Psychiatric: She has a normal mood and affect. Her behavior is normal.  Nursing note and vitals reviewed.   ED Course  Procedures (including critical care time) DIAGNOSTIC STUDIES: Oxygen Saturation is 99% on RA, normal by my interpretation.    COORDINATION OF CARE: 2:49 AM-Discussed treatment plan with pt at bedside and pt agreed to plan.     Labs Review Labs Reviewed  RAPID STREP SCREEN (NOT AT [facility])  CULTURE, GROUP A STREP    Imaging Review No results found.    EKG Interpretation None      MDM   Final diagnoses:  Chronic nonintractable headache, unspecified headache type  URI (upper respiratory infection)       I, Jasenia Weilbacher, personally performed the services described in this documentation. All medical record entries made by the scribe were at my direction and in my presence.  I have reviewed the chart and discharge instructions and agree that the record reflects my personal performance and is accurate and complete. Eular Panek.  03/06/2015. 2:49 AM.   Patient's headache is resolved. Improvement of blood  pressure. No red flag signs or symptoms. Normal neurologic exam. Patient with URI symptoms. Negative strep. We'll treat symptomatically. Return precautions given.      Julianne Rice, MD 03/06/15 [phone]

## 2015-03-06 NOTE — ED Notes (Signed)
Pt A&Ox4, ambulatory at d/c with steady gait, NAD and states she has all of her belongings with her at d/c

## 2015-03-06 NOTE — ED Notes (Signed)
Pt. reports headache and sore throat onset this evening , denies fever , no cough or congestion .

## 2015-03-06 NOTE — Discharge Instructions (Signed)
Upper Respiratory Infection, Adult Most upper respiratory infections (URIs) are caused by a virus. A URI affects the nose, throat, and upper air passages. The most common type of URI is often called "the common cold." HOME CARE   Take medicines only as told by your doctor.  Gargle warm saltwater or take cough drops to comfort your throat as told by your doctor.  Use a warm mist humidifier or inhale steam from a shower to increase air moisture. This may make it easier to breathe.  Drink enough fluid to keep your pee (urine) clear or pale yellow.  Eat soups and other clear broths.  Have a healthy diet.  Rest as needed.  Go back to work when your fever is gone or your doctor says it is okay.  You may need to stay home longer to avoid giving your URI to others.  You can also wear a face mask and wash your hands often to prevent spread of the virus.  Use your inhaler more if you have asthma.  Do not use any tobacco products, including cigarettes, chewing tobacco, or electronic cigarettes. If you need help quitting, ask your doctor. GET HELP IF:  You are getting worse, not better.  Your symptoms are not helped by medicine.  You have chills.  You are getting more short of breath.  You have brown or red mucus.  You have yellow or brown discharge from your nose.  You have pain in your face, especially when you bend forward.  You have a fever.  You have puffy (swollen) neck glands.  You have pain while swallowing.  You have white areas in the back of your throat. GET HELP RIGHT AWAY IF:   You have very bad or constant:  Headache.  Ear pain.  Pain in your forehead, behind your eyes, and over your cheekbones (sinus pain).  Chest pain.  You have long-lasting (chronic) lung disease and any of the following:  Wheezing.  Long-lasting cough.  Coughing up blood.  A change in your usual mucus.  You have a stiff neck.  You have changes in  your:  Vision.  Hearing.  Thinking.  Mood. MAKE SURE YOU:   Understand these instructions.  Will watch your condition.  Will get help right away if you are not doing well or get worse.   This information is not intended to replace advice given to you by your health care provider. Make sure you discuss any questions you have with your health care provider.   Document Released: 10/18/2007 Document Revised: 09/15/2014 Document Reviewed: 08/06/2013 Elsevier Interactive Patient Education 2016 [employer]  Tonsillitis Tonsillitis is an infection of the throat that causes the tonsils to become red, tender, and swollen. Tonsils are collections of lymphoid tissue at the back of the throat. Each tonsil has crevices (crypts). Tonsils help fight nose and throat infections and keep infection from spreading to other parts of the body for the first 18 months of life.  CAUSES Sudden (acute) tonsillitis is usually caused by infection with streptococcal bacteria. Long-lasting (chronic) tonsillitis occurs when the crypts of the tonsils become filled with pieces of food and bacteria, which makes it easy for the tonsils to become repeatedly infected. SYMPTOMS  Symptoms of tonsillitis include:  A sore throat, with possible difficulty swallowing.  White patches on the tonsils.  Fever.  Tiredness.  New episodes of snoring during sleep, when you did not snore before.  Small, foul-smelling, yellowish-white pieces of material (tonsilloliths) that you occasionally cough  up or spit out. The tonsilloliths can also cause you to have bad breath. DIAGNOSIS Tonsillitis can be diagnosed through a physical exam. Diagnosis can be confirmed with the results of lab tests, including a throat culture. TREATMENT  The goals of tonsillitis treatment include the reduction of the severity and duration of symptoms and prevention of associated conditions. Symptoms of tonsillitis can be improved with the use of  steroids to reduce the swelling. Tonsillitis caused by bacteria can be treated with antibiotic medicines. Usually, treatment with antibiotic medicines is started before the cause of the tonsillitis is known. However, if it is determined that the cause is not bacterial, antibiotic medicines will not treat the tonsillitis. If attacks of tonsillitis are severe and frequent, your health care provider may recommend surgery to remove the tonsils (tonsillectomy). HOME CARE INSTRUCTIONS   Rest as much as possible and get plenty of sleep.  Drink plenty of fluids. While the throat is very sore, eat soft foods or liquids, such as sherbet, soups, or instant breakfast drinks.  Eat frozen ice pops.  Gargle with a warm or cold liquid to help soothe the throat. Mix 1/4 teaspoon of salt and 1/4 teaspoon of baking soda in 8 oz of water. SEEK MEDICAL CARE IF:   Large, tender lumps develop in your neck.  A rash develops.  A green, yellow-brown, or bloody substance is coughed up.     You are unable to swallow liquids or food for 24 hours.  You notice that only one of the tonsils is swollen. SEEK IMMEDIATE MEDICAL CARE IF:   You develop any new symptoms such as vomiting, severe headache, stiff neck, chest pain, or trouble breathing or swallowing.  You have severe throat pain along with drooling or voice changes.  You have severe pain, unrelieved with recommended medications.  You are unable to fully open the mouth.  You develop redness, swelling, or severe pain anywhere in the neck.  You have a fever. MAKE SURE YOU:   Understand these instructions.  Will watch your condition.  Will get help right away if you are not doing well or get worse.   This information is not intended to replace advice given to you by your health care provider. Make sure you discuss any questions you have with your health care provider.   Document Released: 02/08/2005 Document Revised: 05/22/2014 Document Reviewed:  10/18/2012 Elsevier Interactive Patient Education 2016 [employer] Headache Without Cause A headache is pain or discomfort felt around the head or neck area. The specific cause of a headache may not be found. There are many causes and types of headaches. A few common ones are:  Tension headaches.  Migraine headaches.  Cluster headaches.  Chronic daily headaches. HOME CARE INSTRUCTIONS  Watch your condition for any changes. Take these steps to help with your condition: Managing Pain  Take over-the-counter and prescription medicines only as told by your health care provider.  Lie down in a dark, quiet room when you have a headache.  If directed, apply ice to the head and neck area:  Put ice in a plastic bag.  Place a towel between your skin and the bag.  Leave the ice on for 20 minutes, 2-3 times per day.  Use a heating pad or hot shower to apply heat to the head and neck area as told by your health care provider.  Keep lights dim if bright lights bother you or make your headaches worse. Eating and Drinking  Eat  meals on a regular schedule.  Limit alcohol use.  Decrease the amount of caffeine you drink, or stop drinking caffeine. General Instructions  Keep all follow-up visits as told by your health care provider. This is important.  Keep a headache journal to help find out what may trigger your headaches. For example, write down:  What you eat and drink.  How much sleep you get.  Any change to your diet or medicines.  Try massage or other relaxation techniques.  Limit stress.  Sit up straight, and do not tense your muscles.  Do not use tobacco products, including cigarettes, chewing tobacco, or e-cigarettes. If you need help quitting, ask your health care provider.  Exercise regularly as told by your health care provider.  Sleep on a regular schedule. Get 7-9 hours of sleep, or the amount recommended by your health care provider. SEEK MEDICAL  CARE IF:   Your symptoms are not helped by medicine.  You have a headache that is different from the usual headache.  You have nausea or you vomit.  You have a fever. SEEK IMMEDIATE MEDICAL CARE IF:   Your headache becomes severe.  You have repeated vomiting.  You have a stiff neck.  You have a loss of vision.  You have problems with speech.  You have pain in the eye or ear.  You have muscular weakness or loss of muscle control.  You lose your balance or have trouble walking.  You feel faint or pass out.  You have confusion.   This information is not intended to replace advice given to you by your health care provider. Make sure you discuss any questions you have with your health care provider.   Document Released: 05/01/2005 Document Revised: 01/20/2015 Document Reviewed: 08/24/2014 Elsevier Interactive Patient Education [employer].

## 2015-03-08 LAB — CULTURE, GROUP A STREP

## 2015-03-24 ENCOUNTER — Emergency Department (HOSPITAL_COMMUNITY)
Admission: EM | Admit: 2015-03-24 | Discharge: 2015-03-24 | Disposition: A | Discharge status: Home / Self Care | Payer: Self-pay | Attending: Emergency Medicine | Admitting: Emergency Medicine

## 2015-03-24 ENCOUNTER — Encounter (HOSPITAL_COMMUNITY): Payer: Self-pay | Admitting: Emergency Medicine

## 2015-03-24 DIAGNOSIS — I10 Essential (primary) hypertension: Secondary | ICD-10-CM | POA: Insufficient documentation

## 2015-03-24 DIAGNOSIS — J302 Other seasonal allergic rhinitis: Secondary | ICD-10-CM

## 2015-03-24 DIAGNOSIS — E669 Obesity, unspecified: Secondary | ICD-10-CM | POA: Insufficient documentation

## 2015-03-24 DIAGNOSIS — Z8659 Personal history of other mental and behavioral disorders: Secondary | ICD-10-CM | POA: Insufficient documentation

## 2015-03-24 DIAGNOSIS — J309 Allergic rhinitis, unspecified: Secondary | ICD-10-CM | POA: Insufficient documentation

## 2015-03-24 DIAGNOSIS — Z79899 Other long term (current) drug therapy: Secondary | ICD-10-CM | POA: Insufficient documentation

## 2015-03-24 DIAGNOSIS — R0981 Nasal congestion: Secondary | ICD-10-CM | POA: Insufficient documentation

## 2015-03-24 MED ORDER — FLUTICASONE PROPIONATE 50 MCG/ACT NA SUSP: 2.0000 | Freq: Every day | NASAL | Status: DC

## 2015-03-24 MED ORDER — FEXOFENADINE HCL 180 MG PO TABS: 180.0000 mg | ORAL_TABLET | Freq: Every day | ORAL | Status: DC

## 2015-03-24 NOTE — Discharge Instructions (Signed)

## 2015-03-24 NOTE — ED Notes (Signed)
Pt. reports sore throat , nasal congestion  and " puffy eyes" onset yesterday , denies fever or chills , no cough or congestion .

## 2015-03-24 NOTE — ED Provider Notes (Signed)
CSN: [id number]     Arrival date & time 03/24/15  2309 History   First MD Initiated Contact with Patient 03/24/15 2330     Chief Complaint  Patient presents with  . Sore Throat  . Nasal Congestion     (Consider location/radiation/quality/duration/timing/severity/associated sxs/prior Treatment) Patient is a 49 y.o. female presenting with allergic reaction. The history is provided by the patient.  Allergic Reaction Presenting symptoms: no difficulty breathing, no difficulty swallowing and no wheezing   Severity:  Mild Prior allergic episodes:  No prior episodes Context comment:  Runny nose, congestion, sore throat, sneezing Relieved by:  Nothing Worsened by:  Nothing tried Ineffective treatments:  None tried   Past Medical History  Diagnosis Date  . Anxiety   . Obesity   . Hypertension    History reviewed. No pertinent past surgical history. No family history on file. Social History  Substance Use Topics  . Smoking status: Never Smoker   . Smokeless tobacco: None  . Alcohol Use: Yes     Comment: social   OB History    No data available     Review of Systems  HENT: Negative for trouble swallowing.   Respiratory: Negative for wheezing.   All other systems reviewed and are negative.     Allergies  Review of patient's allergies indicates no known allergies.  Home Medications   Prior to Admission medications   Medication Sig Start Date End Date Taking? Authorizing Provider  hydrochlorothiazide (HYDRODIURIL) 25 MG tablet Take 1 tablet (25 mg total) by mouth daily. Patient not taking: Reported on 08/18/2014 08/06/14   Lance Bosch, NP  ibuprofen (ADVIL,MOTRIN) 600 MG tablet Take 1 tablet (600 mg total) by mouth every 6 (six) hours as needed. 03/06/15   Julianne Rice, MD  loratadine (CLARITIN) 10 MG tablet Take 1 tablet (10 mg total) by mouth daily. 03/06/15   Julianne Rice, MD  metoCLOPramide (REGLAN) 10 MG tablet Take 1 tablet (10 mg total) by mouth every 6  (six) hours as needed for nausea (headache). 03/06/15   Julianne Rice, MD  naproxen (NAPROSYN) 500 MG tablet Take 1 tablet (500 mg total) by mouth 2 (two) times daily. Patient not taking: Reported on 08/18/2014 08/13/14   Tatyana Kirichenko, PA-C  oxymetazoline (AFRIN NASAL SPRAY) 0.05 % nasal spray Place 1 spray into both nostrils 2 (two) times daily. 03/06/15   Julianne Rice, MD   BP 164/96 mmHg  Pulse 77  Temp(Src) 98.2 F (36.8 C) (Oral)  Resp 16  Ht 5\' 4"  (1.626 m)  Wt 273 lb (123.832 kg)  BMI 46.84 kg/m2  SpO2 95% Physical Exam  Constitutional: She is oriented to person, place, and time. She appears well-developed and well-nourished. No distress.  HENT:  Head: Normocephalic.  Nose: Mucosal edema present.  Mouth/Throat: Oropharynx is clear and moist.  Eyes: Conjunctivae are normal.  Neck: Neck supple. No tracheal deviation present.  Cardiovascular: Normal rate and regular rhythm.   Pulmonary/Chest: Effort normal. No respiratory distress.  Abdominal: Soft. She exhibits no distension.  Neurological: She is alert and oriented to person, place, and time.  Skin: Skin is warm and dry.  Psychiatric: She has a normal mood and affect.    ED Course  Procedures (including critical care time) Labs Review Labs Reviewed - No data to display  Imaging Review No results found. I have personally reviewed and evaluated these images and lab results as part of my medical decision-making.   EKG Interpretation None  MDM   Final diagnoses:  Seasonal allergies    49 y.o. female presents with mild sore throat, sneezing, nasal congestion all without fever or significant cough since recent change in the weather. C/w seasonal allergies. Provided antihistamines and nasal spray for symptoms. Patient needs to establish primary care in the area and was provided contact information to do so.     Leo Grosser, MD 03/25/15 [phone]

## 2015-04-13 ENCOUNTER — Emergency Department (HOSPITAL_COMMUNITY)
Admission: EM | Admit: 2015-04-13 | Discharge: 2015-04-13 | Disposition: A | Discharge status: Home / Self Care | Payer: Self-pay | Attending: Emergency Medicine | Admitting: Emergency Medicine

## 2015-04-13 ENCOUNTER — Encounter (HOSPITAL_COMMUNITY): Payer: Self-pay | Admitting: Emergency Medicine

## 2015-04-13 DIAGNOSIS — I10 Essential (primary) hypertension: Secondary | ICD-10-CM | POA: Insufficient documentation

## 2015-04-13 DIAGNOSIS — F419 Anxiety disorder, unspecified: Secondary | ICD-10-CM | POA: Insufficient documentation

## 2015-04-13 DIAGNOSIS — Z79899 Other long term (current) drug therapy: Secondary | ICD-10-CM | POA: Insufficient documentation

## 2015-04-13 DIAGNOSIS — R519 Headache, unspecified: Secondary | ICD-10-CM

## 2015-04-13 DIAGNOSIS — Z7951 Long term (current) use of inhaled steroids: Secondary | ICD-10-CM | POA: Insufficient documentation

## 2015-04-13 DIAGNOSIS — Z3202 Encounter for pregnancy test, result negative: Secondary | ICD-10-CM | POA: Insufficient documentation

## 2015-04-13 DIAGNOSIS — E669 Obesity, unspecified: Secondary | ICD-10-CM | POA: Insufficient documentation

## 2015-04-13 DIAGNOSIS — R51 Headache: Secondary | ICD-10-CM | POA: Insufficient documentation

## 2015-04-13 LAB — BASIC METABOLIC PANEL
Anion gap: 5 (ref 5–15)
BUN: 6 mg/dL (ref 6–20)
CHLORIDE: 107 mmol/L (ref 101–111)
CO2: 26 mmol/L (ref 22–32)
CREATININE: 0.76 mg/dL (ref 0.44–1.00)
Calcium: 8.8 mg/dL — ABNORMAL LOW (ref 8.9–10.3)
GFR calc non Af Amer: >60 mL/min (ref >60)
Glucose, Bld: 121 mg/dL — ABNORMAL HIGH (ref 65–99)
Potassium: 4.1 mmol/L (ref 3.5–5.1)
SODIUM: 138 mmol/L (ref 135–145)

## 2015-04-13 LAB — CBC WITH DIFFERENTIAL/PLATELET
Basophils Absolute: 0 10*3/uL (ref 0.0–0.1)
Basophils Relative: 0 %
EOS ABS: 0.2 10*3/uL (ref 0.0–0.7)
EOS PCT: 3 %
HCT: 41.7 % (ref 36.0–46.0)
Hemoglobin: 13.3 g/dL (ref 12.0–15.0)
LYMPHS ABS: 2.2 10*3/uL (ref 0.7–4.0)
LYMPHS PCT: 26 %
MCH: 26.4 pg (ref 26.0–34.0)
MCHC: 31.9 g/dL (ref 30.0–36.0)
MCV: 82.7 fL (ref 78.0–100.0)
MONO ABS: 0.3 10*3/uL (ref 0.1–1.0)
MONOS PCT: 4 %
Neutro Abs: 5.6 10*3/uL (ref 1.7–7.7)
Neutrophils Relative %: 67 %
PLATELETS: 238 10*3/uL (ref 150–400)
RBC: 5.04 MIL/uL (ref 3.87–5.11)
RDW: 14.9 % (ref 11.5–15.5)
WBC: 8.4 10*3/uL (ref 4.0–10.5)

## 2015-04-13 LAB — I-STAT BETA HCG BLOOD, ED (MC, WL, AP ONLY)

## 2015-04-13 MED ORDER — KETOROLAC TROMETHAMINE 30 MG/ML IJ SOLN
30.0000 mg | Freq: Once | INTRAMUSCULAR | Status: DC
  Filled 2015-04-13: qty 1

## 2015-04-13 MED ORDER — METOCLOPRAMIDE HCL 5 MG/ML IJ SOLN: 5.0000 mg | Freq: Once | INTRAMUSCULAR | Status: DC

## 2015-04-13 MED ORDER — DIPHENHYDRAMINE HCL 50 MG/ML IJ SOLN
12.5000 mg | Freq: Once | INTRAMUSCULAR | Status: DC
  Filled 2015-04-13: qty 1

## 2015-04-13 MED ORDER — SODIUM CHLORIDE 0.9 % IV BOLUS (SEPSIS): 1000.0000 mL | Freq: Once | INTRAVENOUS | Status: DC

## 2015-04-13 MED ORDER — DEXAMETHASONE SODIUM PHOSPHATE 4 MG/ML IJ SOLN
4.0000 mg | Freq: Once | INTRAMUSCULAR | Status: DC
  Filled 2015-04-13: qty 1

## 2015-04-13 MED ORDER — IBUPROFEN 800 MG PO TABS: 800.0000 mg | ORAL_TABLET | Freq: Three times a day (TID) | ORAL | Status: DC

## 2015-04-13 MED ORDER — METOCLOPRAMIDE HCL 5 MG/ML IJ SOLN
5.0000 mg | Freq: Once | INTRAMUSCULAR | Status: DC
  Filled 2015-04-13: qty 2

## 2015-04-13 MED ORDER — KETOROLAC TROMETHAMINE 30 MG/ML IJ SOLN
30.0000 mg | Freq: Once | INTRAMUSCULAR | Status: AC
  Administered 2015-04-13: 30 mg via INTRAMUSCULAR

## 2015-04-13 NOTE — Discharge Instructions (Signed)
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
## 2015-04-13 NOTE — ED Notes (Signed)
Pt. reports headache onset last night unrelieved by OTC pain medication , denies head injury , no nausea or photophobia .

## 2015-04-20 ENCOUNTER — Emergency Department (HOSPITAL_COMMUNITY)
Admission: EM | Admit: 2015-04-20 | Discharge: 2015-04-21 | Disposition: A | Discharge status: Home / Self Care | Payer: Self-pay | Attending: Emergency Medicine | Admitting: Emergency Medicine

## 2015-04-20 ENCOUNTER — Encounter (HOSPITAL_COMMUNITY): Payer: Self-pay | Admitting: Emergency Medicine

## 2015-04-20 DIAGNOSIS — Z8659 Personal history of other mental and behavioral disorders: Secondary | ICD-10-CM | POA: Insufficient documentation

## 2015-04-20 DIAGNOSIS — G43009 Migraine without aura, not intractable, without status migrainosus: Secondary | ICD-10-CM

## 2015-04-20 DIAGNOSIS — Z7951 Long term (current) use of inhaled steroids: Secondary | ICD-10-CM | POA: Insufficient documentation

## 2015-04-20 DIAGNOSIS — E669 Obesity, unspecified: Secondary | ICD-10-CM | POA: Insufficient documentation

## 2015-04-20 DIAGNOSIS — G43909 Migraine, unspecified, not intractable, without status migrainosus: Secondary | ICD-10-CM | POA: Insufficient documentation

## 2015-04-20 DIAGNOSIS — I10 Essential (primary) hypertension: Secondary | ICD-10-CM | POA: Insufficient documentation

## 2015-04-20 DIAGNOSIS — Z79899 Other long term (current) drug therapy: Secondary | ICD-10-CM | POA: Insufficient documentation

## 2015-04-20 MED ORDER — KETOROLAC TROMETHAMINE 30 MG/ML IJ SOLN
60.0000 mg | Freq: Once | INTRAMUSCULAR | Status: AC
  Administered 2015-04-21: 60 mg via INTRAMUSCULAR
  Filled 2015-04-20: qty 2

## 2015-04-20 NOTE — ED Notes (Addendum)
Pt reports migraine starting approximately 5 hours ago. Took 600 mg Ibuprofen around 1630 with no alleviation of pain. Hx migraines. Has also been having BP issues. Takes HTN medications. Denies light sensitivity at this time. Neurologically intact.

## 2015-04-20 NOTE — ED Provider Notes (Signed)
By signing my name below, I, Helane Gunther, attest that this documentation has been prepared under the direction and in the presence of [employer], DO. Electronically Signed: Helane Gunther, ED Scribe. 04/21/2015. 12:40 AM.   TIME SEEN: 11:41 PM  CHIEF COMPLAINT: Migraine   HPI: Emma Shepherd is a 49 y.o. female with a PMHx of obesity and HTN who presents to the Emergency Department complaining of a throbbing, pressure-like, frontal migraine onset 8 hours ago. She rates her HA as a 9/10 upon arrival, currently reduced to a 7/10. She notes she has been seen for similar HA's before and was told it may be due to HTN, but notes that today her BP has been "very good." She states she has not seen a neurologist for this, but notes she has discussed it with her new PCP. She reports she has taken 600 mg ibuprofen with minimal relief. She notes no exacerbating or alleviating factors. She states she has been having migraines for the past year, usually on the left side, and states that typically Toradol provides effective relief for her. States this feels typical of her chronic headaches. 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 She also notes a PMHx of allergies. She denies taking blood thinners. Denies history of head injury. Pt denies n/v, photophobia, and fever. No new numbness, tingling, focal weakness. No neck pain or neck stiffness. ROS: See HPI Constitutional: no fever  Eyes: no drainage  ENT: no runny nose   Cardiovascular:  no chest pain  Resp: no SOB  GI: no vomiting GU: no dysuria Integumentary: no rash  Allergy: no hives  Musculoskeletal: no leg swelling  Neurological: no slurred speech ROS otherwise negative  PAST MEDICAL HISTORY/PAST SURGICAL HISTORY:  Past Medical History  Diagnosis Date  . Anxiety   . Obesity   . Hypertension     MEDICATIONS:  Prior to Admission medications   Medication Sig Start Date End Date Taking? Authorizing Provider  fexofenadine (ALLEGRA) 180  MG tablet Take 1 tablet (180 mg total) by mouth daily. 03/24/15   Leo Grosser, MD  fluticasone (FLONASE) 50 MCG/ACT nasal spray Place 2 sprays into both nostrils daily. 03/24/15   Leo Grosser, MD  hydrochlorothiazide (HYDRODIURIL) 25 MG tablet Take 1 tablet (25 mg total) by mouth daily. Patient not taking: Reported on 08/18/2014 08/06/14   Lance Bosch, NP  ibuprofen (ADVIL,MOTRIN) 800 MG tablet Take 1 tablet (800 mg total) by mouth 3 (three) times daily. 04/13/15   Gloriann Loan, PA-C  loratadine (CLARITIN) 10 MG tablet Take 1 tablet (10 mg total) by mouth daily. 03/06/15   Julianne Rice, MD  metoCLOPramide (REGLAN) 10 MG tablet Take 1 tablet (10 mg total) by mouth every 6 (six) hours as needed for nausea (headache). 03/06/15   Julianne Rice, MD  naproxen (NAPROSYN) 500 MG tablet Take 1 tablet (500 mg total) by mouth 2 (two) times daily. Patient not taking: Reported on 08/18/2014 08/13/14   Tatyana Kirichenko, PA-C  oxymetazoline (AFRIN NASAL SPRAY) 0.05 % nasal spray Place 1 spray into both nostrils 2 (two) times daily. 03/06/15   Julianne Rice, MD    ALLERGIES:  No Known Allergies  SOCIAL HISTORY:  Social History  Substance Use Topics  . Smoking status: Never Smoker   . Smokeless tobacco: Not on file  . Alcohol Use: Yes     Comment: social    FAMILY HISTORY: History reviewed. No pertinent family history.  EXAM: BP 122/94 mmHg  Pulse 95  Temp(Src)  97.6 F (36.4 C) (Oral)  Resp 18  SpO2 100%  LMP 04/20/2015 (Approximate) CONSTITUTIONAL: Alert and oriented and responds appropriately to questions. Well-appearing; well-nourished HEAD: Normocephalic EYES: Conjunctivae clear, PERRL ENT: normal nose; no rhinorrhea; moist mucous membranes; pharynx without lesions noted NECK: Supple, no meningismus, no LAD, no nuchal rigidity  CARD: RRR; S1 and S2 appreciated; no murmurs, no clicks, no rubs, no gallops RESP: Normal chest excursion without splinting or tachypnea; breath sounds clear  and equal bilaterally; no wheezes, no rhonchi, no rales, no hypoxia or respiratory distress, speaking full sentences ABD/GI: Normal bowel sounds; non-distended; soft, non-tender, no rebound, no guarding, no peritoneal signs BACK:  The back appears normal and is non-tender to palpation, there is no CVA tenderness EXT: Normal ROM in all joints; non-tender to palpation; no edema; normal capillary refill; no cyanosis, no calf tenderness or swelling    SKIN: Normal color for age and race; warm NEURO: Moves all extremities equally, sensation to light touch intact diffusely, cranial nerves II through XII intact PSYCH: The patient's mood and manner are appropriate. Grooming and personal hygiene are appropriate.  MEDICAL DECISION MAKING: Patient here with recurrent headaches. No sudden onset, thunderclap, severe headache. Neurologically intact. No fever, nuchal rigidity or meningismus. Suspect migraine. Will treat with IM Toradol given this is helped her significantly in the past. Suspect discharge home. Do not feel she needs emergent head imaging.  ED PROGRESS: 1:10 AM  Pt's headache almost completely gone after IM Toradol. She reports she feels ready to be discharged home. Discussed return precautions and supportive care instructions. We'll give her outpatient neurology follow-up information. She verbalized understanding and discomfort well with this plan.  I personally performed the services described in this documentation, which was scribed in my presence. The recorded information has been reviewed and is accurate.   [place], DO 04/21/15 [phone]

## 2015-04-21 NOTE — Discharge Instructions (Signed)

## 2015-08-12 DIAGNOSIS — O10019 Pre-existing essential hypertension complicating pregnancy, unspecified trimester: Secondary | ICD-10-CM

## 2015-08-12 NOTE — Congregational Nurse Program (Signed)
Congregational Nurse Program Note  Date of Encounter: 08/12/2015  Past Medical History: Past Medical History  Diagnosis Date  . Anxiety   . Obesity   . Hypertension     Encounter Details:     CNP Questionnaire - 08/12/15 1413    Patient Demographics   Is this a new or existing patient? New   Patient is considered a/an Not Applicable   Race African-American/Black   Patient Assistance   Location of Patient Assistance Not Applicable   Patient's financial/insurance status Low Income;Self-Pay   Uninsured Patient Yes   Interventions Counseled to make appt. with provider   Patient referred to apply for the following financial assistance [employer] insecurities addressed Provided food supplies   Transportation assistance No   Assistance securing medications No   Educational health offerings Cardiac disease;Chronic disease;Hypertension;Navigating the healthcare system   Encounter Details   Primary purpose of visit Chronic Illness/Condition Visit;Education/Health Concerns   Was an Emergency Department visit averted? Not Applicable   Does patient have a medical provider? Yes   Patient referred to Not Applicable   Was a mental health screening completed? (GAINS tool) No   Does patient have dental issues? No   Does patient have vision issues? No   Since previous encounter, have you referred patient for abnormal blood pressure that resulted in a new diagnosis or medication change? No   Since previous encounter, have you referred patient for abnormal blood glucose that resulted in a new diagnosis or medication change? No   For Abstraction Use Only   Does patient have insurance? No     B/P check.  150/102.  Discussed with client elevated B/P.  Client indicated she thought it was due to stress.  Instructed client to return to clinic for recheck and to continue taking medications

## 2015-09-07 ENCOUNTER — Telehealth (HOSPITAL_COMMUNITY): Payer: Self-pay | Admitting: *Deleted

## 2015-09-07 NOTE — Telephone Encounter (Signed)
Telephoned patient at home # and left message with sister. Patient does not have phone number.

## 2015-09-09 DIAGNOSIS — O10019 Pre-existing essential hypertension complicating pregnancy, unspecified trimester: Secondary | ICD-10-CM

## 2015-09-14 NOTE — Congregational Nurse Program (Signed)
Congregational Nurse Program Note  Date of Encounter: 09/09/2015  Past Medical History: Past Medical History  Diagnosis Date  . Anxiety   . Obesity   . Hypertension     Encounter Details:     CNP Questionnaire - 09/09/15 1557    Patient Demographics   Is this a new or existing patient? Existing   Patient is considered a/an Not Applicable   Race African-American/Black   Patient Assistance   Location of Patient Assistance Not Applicable   Patient's financial/insurance status Low Income;Self-Pay   Uninsured Patient Yes   Interventions Counseled to make appt. with provider   Patient referred to apply for the following financial assistance [employer] insecurities addressed Provided food supplies   Transportation assistance No   Assistance securing medications No   Educational health offerings Cardiac disease;Chronic disease;Hypertension;Navigating the healthcare system   Encounter Details   Primary purpose of visit Chronic Illness/Condition Visit;Education/Health Concerns   Was an Emergency Department visit averted? Not Applicable   Does patient have a medical provider? Yes   Patient referred to Clinic   Was a mental health screening completed? (GAINS tool) No   Does patient have dental issues? No   Does patient have vision issues? No   Since previous encounter, have you referred patient for abnormal blood pressure that resulted in a new diagnosis or medication change? No   Since previous encounter, have you referred patient for abnormal blood glucose that resulted in a new diagnosis or medication change? No   For Abstraction Use Only   Does patient have insurance? No     B/P check.  146/100.  Client encouraged to make and keep appointment with Marliss Coots NP at the [facility]

## 2015-09-24 ENCOUNTER — Encounter (HOSPITAL_COMMUNITY): Payer: Self-pay | Admitting: Emergency Medicine

## 2015-09-24 ENCOUNTER — Emergency Department (HOSPITAL_COMMUNITY)
Admission: EM | Admit: 2015-09-24 | Discharge: 2015-09-25 | Disposition: A | Discharge status: Home / Self Care | Payer: Self-pay | Attending: Emergency Medicine | Admitting: Emergency Medicine

## 2015-09-24 DIAGNOSIS — I10 Essential (primary) hypertension: Secondary | ICD-10-CM | POA: Insufficient documentation

## 2015-09-24 DIAGNOSIS — F419 Anxiety disorder, unspecified: Secondary | ICD-10-CM | POA: Insufficient documentation

## 2015-09-24 DIAGNOSIS — Z590 Homelessness: Secondary | ICD-10-CM | POA: Insufficient documentation

## 2015-09-24 DIAGNOSIS — R454 Irritability and anger: Secondary | ICD-10-CM | POA: Insufficient documentation

## 2015-09-24 DIAGNOSIS — E669 Obesity, unspecified: Secondary | ICD-10-CM | POA: Insufficient documentation
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
## 2015-09-24 NOTE — Discharge Instructions (Signed)
Anger Management Anger is a normal human emotion. However, anger can range from mild irritation to rage. When your anger becomes harmful to yourself or others, it is unhealthy anger.  CAUSES  There are many reasons for unhealthy anger. Many people learn how to express anger from observing how their family expressed anger. In troubled, chaotic, or abusive families, anger can be expressed as rage or even violence. Children can grow up never learning how healthy anger can be expressed. Factors that contribute to unhealthy anger include:   Drug or alcohol abuse.  Post-traumatic stress disorder.  Traumatic brain injury. COMPLICATIONS  People with unhealthy anger tend to overreact and retaliate against a real or imagined threat. The need to retaliate can turn into violence or verbal abuse against another person. Chronic anger can lead to health problems, such as hypertension, high blood pressure, and depression. TREATMENT  Exercising, relaxing, meditating, or writing out your feelings all can be beneficial in managing moderate anger. For unhealthy anger, the following methods may be used:  Cognitive-behavioral counseling (learning skills to change the thoughts that influence your mood).  Relaxation training.  Interpersonal counseling.  Assertive communication skills.  Medication.   This information is not intended to replace advice given to you by your health care provider. Make sure you discuss any questions you have with your health care provider.   Document Released: 02/26/2007 Document Revised: 07/24/2011 Document Reviewed: 07/07/2010 Elsevier Interactive Patient Education 2016 [facility] The United Ways 211 is a great source of information about community services available.  Access by dialing 2-1-1 from anywhere in [place], or by website -  CustodianSupply.fi.   Other Local Resources (Updated  05/2015)  [facility]  Crisis Hotline, available 24 hours a day, 7 days a week: [place], [place]   [facility]  Crisis Hotline, available 24 hours a day, 7 days a week: [place], [place]  [facility]  Suicide Prevention Hotline, available 24 hours a day, 7 days a week: [place], [place], available 24 hours a day, 7 days a week: [place], [place] Access to [employer], available 24 hours a day, 7 days a week: 9492431684 All   [facility]  Crisis Hotline, available 24 hours a day, 7 days a week: 7343936860 All   Other Local Resources (Updated 05/2015)  Outpatient Counseling/ Substance Abuse Programs  Services     Address and Phone Number  ADS ([facility]cohol and Drug Services)   Options include Individual counseling, group counseling, intensive outpatient program (several hours a day, several days a week)  Offers depression assessments  Provides methadone maintenance program [phone] [address], [place], [place] partial hospitalization/day treatment and DUI/DWI programs  [employer], private insurance [phone] [address], [phone] [place], [place] [address]  [place] include intensive outpatient program (several hours a day, several days a week), outpatient treatment, DUI/DWI services, family education  Also has some services specifically for [employer] transitional housing  [phone] [address] [place], [place] [address]     [place] Medicare, private pay, and private insurance [phone] [address], [place], [place] [address]  [facility]  Services include individual  counseling, substance abuse intensive outpatient program (several hours a day, several days a week), day treatment  Blinda [place], Medicaid, private insurance [phone] [address], [place], [place] [address]  [place]   Offers substance abuse intensive outpatient program (several hours a day, several days a week), partial hospitalization program [phone] [address] [place], [place] [address]  [phone] [address], [place] [address]  [phone] [place], [place] [address]  [phone], [place], [place] [address]  [facility]  Individual counseling only  Accepts private insurance only [phone] [address] [place], [place] [address]  [facility]: [facility]adone Clinic  Methadone maintenance program [phone] [place], [place] [address]  [facility] providing substance abuse and mental health counseling  Accepts Medicaid, Medicare, private insurance  Offers sliding scale for uninsured [phone] [place], [place] in [place] individual counseling, and intensive in-home services [phone] [address], [place], [place] [address]  [facility] individual counseling, family counseling, group therapy, domestic violence counseling, consumer credit counseling  Accepts Medicare, Medicaid, private insurance  Offers sliding scale for uninsured [phone] [address] [place], [place] [address]  [phone] [facility], [address] [place], [place]  [facility]  Offers individual, family and group counseling  3 locations - [place], [place], and [place]  [place] [address] [place], [place] [address]  [address] [place], [place] [address]  [place], [place] [address]  [facility]    Offers psychiatric assessment, 8-week Intensive Outpatient Program (several hours a day, several times a week, daytime or evenings), early recovery group, family Program, medication management  Private pay or private insurance only [phone], or  [phone] [address] [place], [place] [address]  [facility]  Offers individual, couples and family counseling  Accepts Medicaid, private insurance, and sliding scale for uninsured [phone] [address] [place], [place] [address]  Launa Flight, MD  Individual counseling  Private insurance [phone] [place], [place] [address]  [facility]   Offers assessment, substance abuse treatment, and behavioral health treatment [phone] [address] [place], [place] [address]  [place]  Individual counseling  Accepts private insurance [phone] [place], [place] [address]  [facility]  Individual counseling  Accepts Medicare, private insurance [phone] [place], [place] [address]  [place]    Offers intensive outpatient program (several hours a day, several times a week)  Private pay, private insurance [place], [place]  Individual counseling  Medicare, private insurance [phone] [address], [place], [place] [address]  [facility]    Offers intensive outpatient program (several hours a day, several times a week) and partial hospitalization program [phone] [place], [place] [address]  Letta Moynahan, MD  Individual counseling [phone] [address], [place], [place] [address]  [facility] counseling to individuals, couples,  and families  Accepts Medicare and private insurance; offers sliding scale for uninsured [phone] [place], [place] [address]  [facility]  Christian counseling [phone] [address] [place], [place] [address]  [place]  Offers crisis counseling, individual counseling, group therapy, in-home therapy, domestic violence services, day treatment, DWI services, Conservation officer, nature (CST), Actuary (ACTT), substance abuse Intensive Outpatient Program (several hours a day, several times a week)  2 locations - [place] and [place], [place] [address]  [phone] [address] [place], [place] [address]  [facility] counseling and group therapy  [employer], [place], [place] [phone] [address] [place], [place]  [facility]  Offers individual and family counseling  Offers LGBTQ services  Accepts private insurance and private pay [phone] [place], [place] [address]  [facility]    Offers individual counseling, group therapy, and outpatient detox  Accepts private insurance [phone] [place], [place] Medicare, private insurance [phone] [address] [place], [place] [address]  [employer]  Individual counseling  Accepts Medicare, private insurance [phone] [address], [place] [address]  [facility] [place] substance abuse Intensive Outpatient Program (several hours a day, several times a week) [phone], or [phone] [place], [place]
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
## 2015-11-29 ENCOUNTER — Telehealth (HOSPITAL_COMMUNITY): Payer: Self-pay | Admitting: *Deleted

## 2015-11-29 NOTE — Telephone Encounter (Signed)
Telephoned patient at home # and left message with female that answered

## 2016-02-15 ENCOUNTER — Ambulatory Visit: Payer: Self-pay | Attending: Family Medicine | Admitting: Family Medicine

## 2016-02-15 ENCOUNTER — Encounter: Payer: Self-pay | Admitting: Family Medicine

## 2016-02-15 VITALS — BP 163/100 | HR 86 | Temp 98.4°F | Ht 64.0 in | Wt 273.6 lb

## 2016-02-15 DIAGNOSIS — R102 Pelvic and perineal pain: Secondary | ICD-10-CM | POA: Insufficient documentation

## 2016-02-15 DIAGNOSIS — Z1231 Encounter for screening mammogram for malignant neoplasm of breast: Secondary | ICD-10-CM

## 2016-02-15 DIAGNOSIS — Z79899 Other long term (current) drug therapy: Secondary | ICD-10-CM | POA: Insufficient documentation

## 2016-02-15 DIAGNOSIS — Z124 Encounter for screening for malignant neoplasm of cervix: Secondary | ICD-10-CM

## 2016-02-15 DIAGNOSIS — I10 Essential (primary) hypertension: Secondary | ICD-10-CM | POA: Insufficient documentation

## 2016-02-15 LAB — FSH/LH
FSH: 9.8 m[IU]/mL
LH: 5.5 m[IU]/mL

## 2016-02-15 MED ORDER — HYDROCHLOROTHIAZIDE 25 MG PO TABS: 25.0000 mg | ORAL_TABLET | Freq: Every day | ORAL | 3 refills | Status: DC

## 2016-02-15 MED ORDER — NAPROXEN 500 MG PO TABS: 500.0000 mg | ORAL_TABLET | Freq: Two times a day (BID) | ORAL | 2 refills | Status: DC | PRN

## 2016-02-15 MED FILL — HYDROCHLOROTHIAZIDE 25 MG T: 25 | 30 days supply | Qty: 30 | Fill #0

## 2016-02-15 MED FILL — NAPROXEN 500 MG TABLET: 500 | 30 days supply | Qty: 60 | Fill #0

## 2016-02-15 NOTE — Assessment & Plan Note (Signed)
Uncontrolled HTN Med: none P: Restart HCTZ 25 mg daily

## 2016-02-15 NOTE — Assessment & Plan Note (Signed)
Pelvic pain x one year, now with menstrual changes that are likely related to perimenopause giving age of patient Last pap was abnormal with ASCUS Pap and pelvic done today  Plan: Pelvic and TVUS FSH/LH Naproxen prn with food for pain

## 2016-02-15 NOTE — Patient Instructions (Addendum)
Emma Shepherd was seen today for abdominal pain.  Diagnoses and all orders for this visit:  Pap smear for cervical cancer screening -     Cytology - PAP  Essential hypertension -     hydrochlorothiazide (HYDRODIURIL) 25 MG tablet; Take 1 tablet (25 mg total) by mouth daily.  Pelvic pain -     FSH/LH -     US Pelvis Complete; Future -     US Transvaginal Non-OB; Future -     naproxen (NAPROSYN) 500 MG tablet; Take 1 tablet (500 mg total) by mouth 2 (two) times daily as needed for moderate pain.  Visit for screening mammogram -     MM DIGITAL SCREENING BILATERAL; Future  Other orders -     Cancel: HIV antibody (with reflex)   F/u in 4 weeks for pelvic pain and HTN   Dr. Adrian Blackwater

## 2016-02-15 NOTE — Progress Notes (Signed)
Subjective:  Patient ID: Emma Shepherd, female    DOB: July 16, 1965  Age: 50 y.o. MRN: [id number]  CC: Abdominal Pain   HPI Emma Shepherd presents to re-establish care and for the following:     1. HTN: diagnosed in 05/2013. Has intermittently high blood pressure. With headaches. No CP, SOB, swelling or vision changes.   2. Social stressor: homeless since early 2014. Unemployed at this time. Living with friends.   3. Abdominal pain: started 1 year ago. Feels like menstrual cramps but occur not during menses. LMP 02/06/16. She has been having 1 weeks of cramping, 1 day of heavy bleeding, 2 days of light bleeding. This pattern has been ongoing for past 3- 4 months. She has an IUD for 6 years that was removed in 2011.   Last pap 08/2014: ASCUS, HPV negative. Recommended repeat pap in one year.   Past Medical History:  Diagnosis Date  . Anxiety   . Hypertension 05/2013  . Obesity    Social History  Substance Use Topics  . Smoking status: Never Smoker  . Smokeless tobacco: Not on file  . Alcohol use Yes     Comment: social    Outpatient Medications Prior to Visit  Medication Sig Dispense Refill  . fexofenadine (ALLEGRA) 180 MG tablet Take 1 tablet (180 mg total) by mouth daily. (Patient not taking: Reported on 02/15/2016) 30 tablet 0  . fluticasone (FLONASE) 50 MCG/ACT nasal spray Place 2 sprays into both nostrils daily. (Patient not taking: Reported on 02/15/2016) 16 g 0  . hydrochlorothiazide (HYDRODIURIL) 25 MG tablet Take 1 tablet (25 mg total) by mouth daily. (Patient not taking: Reported on 02/15/2016) 30 tablet 3  . ibuprofen (ADVIL,MOTRIN) 800 MG tablet Take 1 tablet (800 mg total) by mouth 3 (three) times daily. (Patient not taking: Reported on 02/15/2016) 21 tablet 0  . loratadine (CLARITIN) 10 MG tablet Take 1 tablet (10 mg total) by mouth daily. (Patient not taking: Reported on 02/15/2016) 30 tablet 0  . metoCLOPramide (REGLAN) 10 MG tablet Take 1 tablet (10 mg total) by  mouth every 6 (six) hours as needed for nausea (headache). (Patient not taking: Reported on 02/15/2016) 15 tablet 0  . naproxen (NAPROSYN) 500 MG tablet Take 1 tablet (500 mg total) by mouth 2 (two) times daily. (Patient not taking: Reported on 02/15/2016) 30 tablet 0   No facility-administered medications prior to visit.     ROS Review of Systems  Constitutional: Negative for chills and fever.  Eyes: Negative for visual disturbance.  Respiratory: Negative for shortness of breath.   Cardiovascular: Negative for chest pain.  Gastrointestinal: Negative for abdominal pain and blood in stool.  Genitourinary: Positive for menstrual problem and pelvic pain.  Musculoskeletal: Negative for arthralgias and back pain.  Skin: Negative for rash.  Allergic/Immunologic: Negative for immunocompromised state.  Neurological: Positive for headaches.  Hematological: Negative for adenopathy. Does not bruise/bleed easily.  Psychiatric/Behavioral: Negative for dysphoric mood and suicidal ideas.    Objective:  BP (!) 163/100 (BP Location: Left Arm)   Pulse 86   Temp 98.4 F (36.9 C) (Oral)   Ht 5\' 4"  (1.626 m)   Wt 273 lb 9.6 oz (124.1 kg)   LMP 02/10/2016   SpO2 95%   BMI 46.96 kg/m   BP/Weight 02/15/2016 09/25/2015 123456  Systolic BP [id number] -  Diastolic BP 123XX123 93 -  Wt. (Lbs) 273.6 - 270  BMI 46.96 - 45.65    Physical Exam  Constitutional: She is oriented to  person, place, and time. She appears well-developed and well-nourished. No distress.  Morbidly obese   HENT:  Head: Normocephalic and atraumatic.  Cardiovascular: Normal rate, regular rhythm, normal heart sounds and intact distal pulses.   Pulmonary/Chest: Effort normal and breath sounds normal.  Genitourinary: Uterus normal. Pelvic exam was performed with patient prone. There is no rash, tenderness or lesion on the right labia. There is no rash, tenderness or lesion on the left labia. Cervix exhibits no motion tenderness, no  discharge and no friability. Vaginal discharge (scant mucoid white vagnial discharge ) found.  Musculoskeletal: She exhibits edema (trace LE edema ).  Lymphadenopathy:       Right: No inguinal adenopathy present.       Left: No inguinal adenopathy present.  Neurological: She is alert and oriented to person, place, and time.  Skin: Skin is warm and dry. No rash noted.  Psychiatric: She has a normal mood and affect.     Assessment & Plan:  Mahkenzie was seen today for abdominal pain.  Diagnoses and all orders for this visit:  Pap smear for cervical cancer screening -     Cytology - PAP  Essential hypertension -     hydrochlorothiazide (HYDRODIURIL) 25 MG tablet; Take 1 tablet (25 mg total) by mouth daily.  Pelvic pain -     FSH/LH -     US Pelvis Complete; Future -     US Transvaginal Non-OB; Future -     naproxen (NAPROSYN) 500 MG tablet; Take 1 tablet (500 mg total) by mouth 2 (two) times daily as needed for moderate pain.  Visit for screening mammogram -     MM DIGITAL SCREENING BILATERAL; Future  Screening for HIV (human immunodeficiency virus) -     Cancel: HIV antibody (with reflex)   There are no diagnoses linked to this encounter.  No orders of the defined types were placed in this encounter.   Follow-up: Return in about 4 weeks (around 03/14/2016) for pelvic pain .   Boykin Nearing MD

## 2016-02-15 NOTE — Progress Notes (Signed)
Pt is having abnormal bleeding, first day of period she sees no blood, second day she is bleeding havey, and on the third day there is no blood.  Pt is also having very bad cramps on the first day of period, and back pain.  Pt is also having leg pain.  Pt has not been on BP medication since last visit.  Pt is getting flu shot today.

## 2016-02-16 ENCOUNTER — Telehealth: Payer: Self-pay

## 2016-02-16 LAB — CERVICOVAGINAL ANCILLARY ONLY: WET PREP (BD AFFIRM): NEGATIVE

## 2016-02-16 LAB — CYTOLOGY - PAP

## 2016-02-16 NOTE — Telephone Encounter (Signed)
Patient verified DOB. Patient wanted to know about lab results that were drawn yesterday. Patient is aware that nurse will call with final results considering all results are not available at the time.

## 2016-02-19 LAB — HIV ANTIBODY (ROUTINE TESTING W REFLEX): HIV: NONREACTIVE

## 2016-02-22 ENCOUNTER — Ambulatory Visit (HOSPITAL_COMMUNITY)
Admission: RE | Admit: 2016-02-22 | Discharge: 2016-02-22 | Disposition: A | Discharge status: Home / Self Care | Payer: Self-pay | Source: Ambulatory Visit | Attending: Family Medicine | Admitting: Family Medicine

## 2016-02-22 DIAGNOSIS — R102 Pelvic and perineal pain: Secondary | ICD-10-CM | POA: Insufficient documentation

## 2016-02-22 DIAGNOSIS — N938 Other specified abnormal uterine and vaginal bleeding: Secondary | ICD-10-CM | POA: Insufficient documentation

## 2016-02-22 DIAGNOSIS — D259 Leiomyoma of uterus, unspecified: Secondary | ICD-10-CM | POA: Insufficient documentation

## 2016-02-23 ENCOUNTER — Telehealth: Payer: Self-pay | Admitting: *Deleted

## 2016-02-23 ENCOUNTER — Encounter: Payer: Self-pay | Admitting: Family Medicine

## 2016-02-23 DIAGNOSIS — R102 Pelvic and perineal pain: Secondary | ICD-10-CM

## 2016-02-23 NOTE — Assessment & Plan Note (Signed)
1 small fibroid on ultrasound   I recommend trial of birth control pills if naproxen does not control pelvic pain  Also If bleeding becomes prolongedI will refer to gynecology for further evaluation

## 2016-02-23 NOTE — Telephone Encounter (Signed)
Patient present to the office for lab results.  Patient was informed of HIV, HPV, Wet Prep being negative. Patient is aware of Pap being normal and Currently not being in the menopause state. Patient is aware of 1 small fibroid being noted on the [place]. PCP would advise a trial of birth control pills if naprosyn is not providing pelvic relief.  Patient is also aware of gynecological referral being placed if bleeding and pain persist until 2 week FU. Patient expressed her understanding and stated she would take medication as prescribed over the next two weeks and FU with PCP accordingly. No further questions at this time.

## 2016-02-23 NOTE — Progress Notes (Signed)
MA spoke with the patient in the clinic and relayed all results to her.
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
## 2016-05-26 ENCOUNTER — Encounter (HOSPITAL_COMMUNITY): Payer: Self-pay | Admitting: *Deleted

## 2016-05-26 ENCOUNTER — Emergency Department (HOSPITAL_COMMUNITY)
Admission: EM | Admit: 2016-05-26 | Discharge: 2016-05-26 | Disposition: A | Discharge status: Home / Self Care | Payer: Self-pay | Attending: Emergency Medicine | Admitting: Emergency Medicine

## 2016-05-26 DIAGNOSIS — K0889 Other specified disorders of teeth and supporting structures: Secondary | ICD-10-CM | POA: Insufficient documentation

## 2016-05-26 DIAGNOSIS — I10 Essential (primary) hypertension: Secondary | ICD-10-CM | POA: Insufficient documentation

## 2016-05-26 DIAGNOSIS — Z79899 Other long term (current) drug therapy: Secondary | ICD-10-CM | POA: Insufficient documentation

## 2016-05-26 MED ORDER — NAPROXEN 500 MG PO TABS: 500.0000 mg | ORAL_TABLET | Freq: Two times a day (BID) | ORAL | 0 refills | Status: DC

## 2016-05-26 MED ORDER — PENICILLIN V POTASSIUM 500 MG PO TABS: 500.0000 mg | ORAL_TABLET | Freq: Three times a day (TID) | ORAL | 0 refills | Status: DC

## 2016-05-26 MED ORDER — HYDROCODONE-ACETAMINOPHEN 5-325 MG PO TABS
1.0000 | ORAL_TABLET | Freq: Once | ORAL | Status: AC
  Administered 2016-05-26: 1 via ORAL
  Filled 2016-05-26: qty 1

## 2016-05-26 NOTE — ED Provider Notes (Signed)
[facility] DEPT Provider Note    By signing my name below, I, Bea Graff, attest that this documentation has been prepared under the direction and in the presence of Harlene Ramus, PA-C. Electronically Signed: Bea Graff, ED Scribe. 05/26/16. 10:12 AM.    History   Chief Complaint Chief Complaint  Patient presents with  . Dental Pain    The history is provided by the patient and medical records. No language interpreter was used.    Emma Shepherd is a homeless obese 51 y.o. female who presents to the Emergency Department complaining of waxing and waning left lower dental pain that began approximately one year ago secondary to losing a filling in the tooth. She states the pain became severe yesterday morning. She reports associated subjective fever and chills. She reports having a sour taste in her mouth intermittently. She has been taking Naproxen and using Orajel for pain with minimal relief. Swallowing increases her pain. Pt denies alleviating factors. He denies fever, chills, drooling, difficulty swallowing or breathing, nausea, vomiting, abdominal pain, trismus or facial swelling. She denies any recent antibiotic use. She denies allergies to any medications.    Past Medical History:  Diagnosis Date  . Anxiety   . Hypertension 05/2013  . Obesity     Patient Active Problem List   Diagnosis Date Noted  . Pelvic pain 02/15/2016  . Severe obesity (BMI >= 40) ([place]) 02/15/2016  . Bipolar affective disorder, currently manic, severe, with psychotic features ([place]) 10/15/2014  . HTN (hypertension) 08/06/2014    History reviewed. No pertinent surgical history.  OB History    No data available       Home Medications    Prior to Admission medications   Medication Sig Start Date End Date Taking? Authorizing Provider  hydrochlorothiazide (HYDRODIURIL) 25 MG tablet Take 1 tablet (25 mg total) by mouth daily. 02/15/16   Josalyn Funches, MD  naproxen (NAPROSYN) 500  MG tablet Take 1 tablet (500 mg total) by mouth 2 (two) times daily. 05/26/16   Nona Dell, PA-C  penicillin v potassium (VEETID) 500 MG tablet Take 1 tablet (500 mg total) by mouth 3 (three) times daily. 05/26/16 06/02/16  Nona Dell, PA-C    Family History No family history on file.  Social History Social History  Substance Use Topics  . Smoking status: Never Smoker  . Smokeless tobacco: Never Used  . Alcohol use Yes     Comment: social     Allergies   Patient has no known allergies.   Review of Systems Review of Systems  Constitutional: Positive for chills and fever.  HENT: Positive for dental problem. Negative for drooling, facial swelling and trouble swallowing.   Respiratory: Negative for shortness of breath.   Gastrointestinal: Negative for abdominal pain, nausea and vomiting.     Physical Exam Updated Vital Signs BP 160/95 (BP Location: Left Arm)   Pulse 90   Temp 97.9 F (36.6 C) (Oral)   Resp 14   Ht 5\' 5"  (1.651 m)   Wt 252 lb (114.3 kg)   LMP 03/27/2016 (Approximate)   SpO2 99%   BMI 41.93 kg/m   Physical Exam  Constitutional: She is oriented to person, place, and time. She appears well-developed and well-nourished.  HENT:  Head: Normocephalic and atraumatic.  Mouth/Throat: Uvula is midline, oropharynx is clear and moist and mucous membranes are normal. No oral lesions. No trismus in the jaw. Abnormal dentition. Dental caries present. No dental abscesses or uvula swelling. No oropharyngeal exudate,  posterior oropharyngeal edema, posterior oropharyngeal erythema or tonsillar abscesses. No tonsillar exudate.    No facial or neck swelling. Floor of mouth soft. Pt tolerating secretions.  Eyes: Conjunctivae and EOM are normal. Right eye exhibits no discharge. Left eye exhibits no discharge. No scleral icterus.  Neck: Normal range of motion. Neck supple.  Cardiovascular: Normal rate.   Pulmonary/Chest: Effort normal.    Musculoskeletal: Normal range of motion.  Lymphadenopathy:    She has no cervical adenopathy.  Neurological: She is alert and oriented to person, place, and time.  Skin: Skin is warm and dry.  Nursing note and vitals reviewed.    ED Treatments / Results  DIAGNOSTIC STUDIES: Oxygen Saturation is 99% on RA, normal by my interpretation.   COORDINATION OF CARE: 10:09 AM- Offered dental block but pt declined. Will prescribe antibiotic and pain medication. Will give first dose prior to discharge. Pt verbalizes understanding and agrees to plan.  Medications  HYDROcodone-acetaminophen (NORCO/VICODIN) 5-325 MG per tablet 1 tablet (not administered)    Labs (all labs ordered are listed, but only abnormal results are displayed) Labs Reviewed - No data to display  EKG  EKG Interpretation None       Radiology No results found.  Procedures Procedures (including critical care time)  Medications Ordered in ED Medications  HYDROcodone-acetaminophen (NORCO/VICODIN) 5-325 MG per tablet 1 tablet (not administered)     Initial Impression / Assessment and Plan / ED Course  I have reviewed the triage vital signs and the nursing notes.  Pertinent labs & imaging results that were available during my care of the patient were reviewed by me and considered in my medical decision making (see chart for details).  Clinical Course     Patient with dentalgia. No abscess requiring immediate incision and drainage. Exam not concerning for Ludwig's angina or pharyngeal abscess. Pt declined dental block in the ED. Will treat with PCN and Naproxen. Pt instructed to follow-up with dentist and given resource guide. Discussed return precautions. Pt safe for discharge.   I personally performed the services described in this documentation, which was scribed in my presence. The recorded information has been reviewed and is accurate.   Final Clinical Impressions(s) / ED Diagnoses   Final diagnoses:   Pain, dental    New Prescriptions New Prescriptions   NAPROXEN (NAPROSYN) 500 MG TABLET    Take 1 tablet (500 mg total) by mouth 2 (two) times daily.   PENICILLIN V POTASSIUM (VEETID) 500 MG TABLET    Take 1 tablet (500 mg total) by mouth 3 (three) times daily.     Chesley Noon [place], [place] 05/26/16 1028    Duffy Bruce, MD 05/27/16 1210

## 2016-05-26 NOTE — Discharge Instructions (Signed)
Take medications as prescribed. You may also take 600 mg ibuprofen 4 times daily as needed for pain relief. I recommend eating prior to taking ibuprofen to prevent gastrointestinal side effects. You may apply ice to affected area for 15-20 minutes 3-4 times daily to help with pain. °Follow-up with one of the dental clinics listed below for further management of your dental pain. °Return to the emergency department if symptoms worsen or new onset of fever, headache, neck stiffness, facial/neck swelling, unable to open jaw, unable to swallow resulting in drooling, difficulty breathing, drainage, unable to tolerate fluids.  ° °[employer] °[employer] °[facility] °[address] °[place], [place] [address] °Phone [phone] ° °[facility] [employer] [facility] in [place], [place], exemplifies the Dental School?s vision to improve the health and quality of life of all [place]ians by creating leaders with a passion to care for the underserved and by leading the nation in community-based, service learning oral health education. ° °We are committed to offering comprehensive general dental services for adults, children and special needs patients in a safe, caring and professional setting. ° ° °Appointments: Our clinic is open Monday through Friday 8:00 a.m. until 5:00 p.m. The amount of time scheduled for an appointment depends on the patient?s specific needs. We ask that you keep your appointed time for care or provide 24-hour notice of all appointment changes. Parents or legal guardians must accompany minor children. °  °Payment for Services: Medicaid and other insurance plans are welcome. Payment for services is due when services are rendered and may be made by cash or credit card. If you have dental insurance, we will assist you with your claim submission. °   °Emergencies:   Emergency services will be provided Monday through Friday on a walk-in basis.  Please arrive early for emergency services. After hours emergency services will be provided for patients of record as required. °  °Services:  °Comprehensive General Dentistry °Children?s Dentistry °Oral Surgery - Extractions °Root Canals °Sealants and Tooth Colored Fillings °Crowns and Bridges °Dentures and Partial Dentures °Implant Services °Periodontal Services and Cleanings °Cosmetic Tooth Whitening °Digital Radiography °3-D/[facility]  °

## 2016-05-26 NOTE — ED Notes (Signed)
Pt is in stable condition upon d/c and ambulates from ED. 

## 2016-05-26 NOTE — ED Triage Notes (Signed)
Pt reports L lower dental pain with broken tooth, no facial swelling present onset for a few months, denies fever, pt denies drainage from the tooth, pt A&O x4

## 2016-05-27 ENCOUNTER — Encounter (HOSPITAL_COMMUNITY): Payer: Self-pay | Admitting: Emergency Medicine

## 2016-05-27 ENCOUNTER — Emergency Department (HOSPITAL_COMMUNITY)
Admission: EM | Admit: 2016-05-27 | Discharge: 2016-05-27 | Disposition: A | Discharge status: Home / Self Care | Payer: Self-pay | Attending: Emergency Medicine | Admitting: Emergency Medicine

## 2016-05-27 DIAGNOSIS — Z79899 Other long term (current) drug therapy: Secondary | ICD-10-CM | POA: Insufficient documentation

## 2016-05-27 DIAGNOSIS — K0889 Other specified disorders of teeth and supporting structures: Secondary | ICD-10-CM | POA: Insufficient documentation

## 2016-05-27 DIAGNOSIS — I10 Essential (primary) hypertension: Secondary | ICD-10-CM | POA: Insufficient documentation

## 2016-05-27 MED ORDER — PENICILLIN V POTASSIUM 500 MG PO TABS: 500.0000 mg | ORAL_TABLET | Freq: Three times a day (TID) | ORAL | 0 refills | Status: AC

## 2016-05-27 MED ORDER — BUPIVACAINE-EPINEPHRINE (PF) 0.5% -1:200000 IJ SOLN
1.8000 mL | Freq: Once | INTRAMUSCULAR | Status: AC
  Administered 2016-05-27: 1.8 mL
  Filled 2016-05-27: qty 1.8

## 2016-05-27 MED ORDER — NAPROXEN 500 MG PO TABS: 500.0000 mg | ORAL_TABLET | Freq: Two times a day (BID) | ORAL | 0 refills | Status: DC

## 2016-05-27 NOTE — ED Triage Notes (Signed)
Patient with dental pain, was seen here yesterday for same and pain is not controlled.  She has been taking Naproxen and Ibuprofen without pain control.  She was given vicodin and that controlled her pain.

## 2016-05-27 NOTE — ED Notes (Signed)
Domenic Moras, PA at bedside with patient at this time.

## 2016-05-27 NOTE — ED Provider Notes (Signed)
[facility] DEPT Provider Note   CSN: [id number] Arrival date & time: 05/27/16  0443     History   Chief Complaint Chief Complaint  Patient presents with  . Dental Pain    HPI Emma Shepherd is a 51 y.o. female.  HPI   51 year old female presents complaining of dental pain. Patient is homeless. She was seen in ED yesterday with complaints of left lower dental pain that began approximately a year ago after she lost a filling. Pain became more intense for the past 2 days. She reportedly taking naproxen and Orajel for pain with minimal relief. Denies any associated fever or chills, trouble swallowing, nausea vomiting diarrhea. Patient was evaluated and was discharged with penicillin and naproxen. Dental referral was given as well as resource guide. She returned today complaining of inadequate control of her dental pain. Describe pain as a sharp achy sensation, persistent, radiates to her jaw. Denies fever, trouble swallowing, neck pain, or rash. She has tried to follow-up with the dentist but unable to get an appointment. She has not filled her antibiotics or anti-inflammatory medication.  Past Medical History:  Diagnosis Date  . Anxiety   . Hypertension 05/2013  . Obesity     Patient Active Problem List   Diagnosis Date Noted  . Pelvic pain 02/15/2016  . Severe obesity (BMI >= 40) ([place]) 02/15/2016  . Bipolar affective disorder, currently manic, severe, with psychotic features ([place]) 10/15/2014  . HTN (hypertension) 08/06/2014    History reviewed. No pertinent surgical history.  OB History    No data available       Home Medications    Prior to Admission medications   Medication Sig Start Date End Date Taking? Authorizing Provider  hydrochlorothiazide (HYDRODIURIL) 25 MG tablet Take 1 tablet (25 mg total) by mouth daily. 02/15/16   Josalyn Funches, MD  naproxen (NAPROSYN) 500 MG tablet Take 1 tablet (500 mg total) by mouth 2 (two) times daily. 05/26/16   Nona Dell, PA-C  penicillin v potassium (VEETID) 500 MG tablet Take 1 tablet (500 mg total) by mouth 3 (three) times daily. 05/26/16 06/02/16  Nona Dell, PA-C    Family History No family history on file.  Social History Social History  Substance Use Topics  . Smoking status: Never Smoker  . Smokeless tobacco: Never Used  . Alcohol use Yes     Comment: social     Allergies   Patient has no known allergies.   Review of Systems Review of Systems  Constitutional: Negative for fever.  HENT: Positive for dental problem.      Physical Exam Updated Vital Signs BP 165/95 (BP Location: Left Arm)   Pulse 88   Temp 98.7 F (37.1 C) (Oral)   Resp 18   Ht 5\' 5"  (1.651 m)   Wt 93 kg   LMP 03/27/2016 (Approximate)   SpO2 98%   BMI 34.11 kg/m   Physical Exam  Constitutional: She appears well-developed and well-nourished. No distress.  HENT:  Head: Atraumatic.  Mouth: Dental decay noted to tooth #17, tenderness to palpation without surrounding gingival erythema or abscess. No trismus.  Eyes: Conjunctivae are normal.  Neck: Neck supple.  Lymphadenopathy:    She has no cervical adenopathy.  Neurological: She is alert.  Skin: No rash noted.  Psychiatric: She has a normal mood and affect.  Nursing note and vitals reviewed.    ED Treatments / Results  Labs (all labs ordered are listed, but only abnormal results are displayed)  Labs Reviewed - No data to display  EKG  EKG Interpretation None       Radiology No results found.  Procedures .Nerve Block Date/Time: 05/27/2016 7:00 AM Performed by: Domenic Moras Authorized by: Domenic Moras   Consent:    Consent obtained:  Verbal   Consent given by:  Patient   Risks discussed:  Infection   Alternatives discussed:  No treatment and referral Indications:    Indications:  Pain relief Location:    Body area:  Head   Head nerve blocked: inferior alveolar.   Laterality:  Left Skin anesthesia (see MAR  for exact dosages):    Skin anesthesia method:  None Procedure details (see MAR for exact dosages):    Block needle gauge:  27 G   Anesthetic injected:  Bupivacaine 0.5% WITH epi   Steroid injected:  None   Injection procedure:  Anatomic landmarks identified   Paresthesia:  Immediately resolved Post-procedure details:    Dressing:  None   Outcome:  Pain relieved   Patient tolerance of procedure:  Tolerated well, no immediate complications   (including critical care time)    Medications Ordered in ED Medications  bupivacaine-epinephrine (MARCAINE W/ EPI) 0.5% -1:200000 injection 1.8 mL (not administered)     Initial Impression / Assessment and Plan / ED Course  I have reviewed the triage vital signs and the nursing notes.  Pertinent labs & imaging results that were available during my care of the patient were reviewed by me and considered in my medical decision making (see chart for details).  Clinical Course     BP 165/95 (BP Location: Left Arm)   Pulse 88   Temp 98.7 F (37.1 C) (Oral)   Resp 18   Ht 5\' 5"  (1.651 m)   Wt 93 kg   LMP 03/27/2016 (Approximate)   SpO2 98%   BMI 34.11 kg/m    Final Clinical Impressions(s) / ED Diagnoses   Final diagnoses:  Pain, dental    New Prescriptions Current Discharge Medication List     6:34 AM Persistent dental pain, was seen yesterday for same and was prescribed PCN and NSAIDs but have not fill it yet.  On exam pt does have dental decay but no abscess or other deep tissue infection.  Will perform dental block.  Encourage pt to f/u with dentist and to fill her medications for further care.    7:03 AM Successful dental block.  Pt felt better.  D/c with prescription of PCN, and NSAIDs.  Dental referral given.    Domenic Moras, PA-C 05/27/16 [place], DO 05/27/16 [id number]

## 2016-05-27 NOTE — ED Notes (Signed)
Bowie Tran, PA at bedside at this time.  

## 2016-05-28 NOTE — Congregational Nurse Program (Signed)
Congregational Nurse Program Note  Date of Encounter: 05/19/2016  Past Medical History: Past Medical History:  Diagnosis Date  . Anxiety   . Hypertension 05/2013  . Obesity     Encounter Details:     CNP Questionnaire - 05/19/16 1137      Patient Demographics   Is this a new or existing patient? Existing   Patient is considered a/an Not Applicable   Race African-American/Black     Patient Assistance   Location of Patient Assistance Not Applicable   Patient's financial/insurance status Low Income;Self-Pay (Uninsured)   Uninsured Patient ([facility]) Yes   Interventions Not Applicable   Patient referred to apply for the following financial assistance [place] insecurities addressed Not Research scientist (physical sciences) No   Assistance securing medications No   Sport and exercise psychologist health     Encounter Details   Primary purpose of visit Spiritual Care/Support Visit;Safety   Was an Emergency Department visit averted? Not Applicable   Does patient have a medical provider? Yes   Patient referred to Not Applicable   Was a mental health screening completed? (GAINS tool) No   Does patient have dental issues? No   Does patient have vision issues? No   Does your patient have an abnormal blood pressure today? No   Since previous encounter, have you referred patient for abnormal blood pressure that resulted in a new diagnosis or medication change? No   Does your patient have an abnormal blood glucose today? No   Since previous encounter, have you referred patient for abnormal blood glucose that resulted in a new diagnosis or medication change? No   Was there a life-saving intervention made? No     Requested B/P check.  140/100.  Instructed to see Marliss Coots NP to evaluate antihypertensive medication

## 2016-08-21 ENCOUNTER — Telehealth: Payer: Self-pay

## 2016-08-21 NOTE — Telephone Encounter (Signed)
Called to find out if pt would like to have colonoscopy scheduled 

## 2016-09-02 ENCOUNTER — Encounter (HOSPITAL_COMMUNITY): Payer: Self-pay

## 2016-09-02 DIAGNOSIS — I10 Essential (primary) hypertension: Secondary | ICD-10-CM | POA: Insufficient documentation

## 2016-09-02 DIAGNOSIS — R197 Diarrhea, unspecified: Secondary | ICD-10-CM | POA: Insufficient documentation

## 2016-09-02 DIAGNOSIS — Z79899 Other long term (current) drug therapy: Secondary | ICD-10-CM | POA: Insufficient documentation

## 2016-09-02 LAB — COMPREHENSIVE METABOLIC PANEL
ALBUMIN: 3.7 g/dL (ref 3.5–5.0)
ALT: 22 U/L (ref 14–54)
ANION GAP: 10 (ref 5–15)
AST: 31 U/L (ref 15–41)
Alkaline Phosphatase: 62 U/L (ref 38–126)
BUN: 14 mg/dL (ref 6–20)
CO2: 23 mmol/L (ref 22–32)
Calcium: 8.9 mg/dL (ref 8.9–10.3)
Chloride: 105 mmol/L (ref 101–111)
Creatinine, Ser: 1.03 mg/dL — ABNORMAL HIGH (ref 0.44–1.00)
GFR calc non Af Amer: >60 mL/min (ref >60)
GLUCOSE: 101 mg/dL — AB (ref 65–99)
Potassium: 4.1 mmol/L (ref 3.5–5.1)
SODIUM: 138 mmol/L (ref 135–145)
Total Bilirubin: 0.6 mg/dL (ref 0.3–1.2)
Total Protein: 7.2 g/dL (ref 6.5–8.1)

## 2016-09-02 LAB — CBC
HCT: 39 % (ref 36.0–46.0)
HEMOGLOBIN: 13 g/dL (ref 12.0–15.0)
MCH: 27 pg (ref 26.0–34.0)
MCHC: 33.3 g/dL (ref 30.0–36.0)
MCV: 80.9 fL (ref 78.0–100.0)
Platelets: 250 10*3/uL (ref 150–400)
RBC: 4.82 MIL/uL (ref 3.87–5.11)
RDW: 15.2 % (ref 11.5–15.5)
WBC: 11.1 10*3/uL — ABNORMAL HIGH (ref 4.0–10.5)

## 2016-09-02 LAB — LIPASE, BLOOD: Lipase: 23 U/L (ref 11–51)

## 2016-09-02 LAB — I-STAT BETA HCG BLOOD, ED (MC, WL, AP ONLY): I-stat hCG, quantitative: <5 m[IU]/mL (ref <5)

## 2016-09-02 NOTE — ED Triage Notes (Signed)
Pt endorses 3-4 watery stools x 3 days with abd pain. Denies n/v. VSS.

## 2016-09-03 ENCOUNTER — Emergency Department (HOSPITAL_COMMUNITY)
Admission: EM | Admit: 2016-09-03 | Discharge: 2016-09-03 | Disposition: A | Discharge status: Home / Self Care | Payer: Self-pay | Attending: Emergency Medicine | Admitting: Emergency Medicine

## 2016-09-03 DIAGNOSIS — R197 Diarrhea, unspecified: Secondary | ICD-10-CM

## 2016-09-03 LAB — URINALYSIS, ROUTINE W REFLEX MICROSCOPIC
Bilirubin Urine: NEGATIVE
GLUCOSE, UA: NEGATIVE mg/dL
HGB URINE DIPSTICK: NEGATIVE
Ketones, ur: NEGATIVE mg/dL
LEUKOCYTES UA: NEGATIVE
Nitrite: NEGATIVE
Protein, ur: NEGATIVE mg/dL
SPECIFIC GRAVITY, URINE: 1.021 (ref 1.005–1.030)
pH: 5 (ref 5.0–8.0)

## 2016-09-03 NOTE — ED Notes (Signed)
Pt understood dc material. NAD noted. 

## 2016-09-03 NOTE — ED Provider Notes (Signed)
[facility] DEPT Provider Note   CSN: [id number] Arrival date & time: 09/02/16  2147     History   Chief Complaint Chief Complaint  Patient presents with  . Diarrhea    HPI Emma Shepherd is a 51 y.o. female.  Patient presents with concern for loose bowel movements for the past 4 days. No abdominal pain, fever, nausea or vomiting. She states she has one large, non-bloody, watery bowel movement per day with small amounts that accompany urination through the day. She is eating less but continues to drink plenty of fluids.    The history is provided by the patient. No language interpreter was used.  Diarrhea   Pertinent negatives include no abdominal pain, no vomiting, no chills and no myalgias.    Past Medical History:  Diagnosis Date  . Anxiety   . Hypertension 05/2013  . Obesity     Patient Active Problem List   Diagnosis Date Noted  . Pelvic pain 02/15/2016  . Severe obesity (BMI >= 40) ([place]) 02/15/2016  . Bipolar affective disorder, currently manic, severe, with psychotic features ([place]) 10/15/2014  . HTN (hypertension) 08/06/2014    History reviewed. No pertinent surgical history.  OB History    No data available       Home Medications    Prior to Admission medications   Medication Sig Start Date End Date Taking? Authorizing Provider  hydrochlorothiazide (HYDRODIURIL) 25 MG tablet Take 1 tablet (25 mg total) by mouth daily. 02/15/16   Josalyn Funches, MD  naproxen (NAPROSYN) 500 MG tablet Take 1 tablet (500 mg total) by mouth 2 (two) times daily. 05/27/16   Domenic Moras, PA-C    Family History History reviewed. No pertinent family history.  Social History Social History  Substance Use Topics  . Smoking status: Never Smoker  . Smokeless tobacco: Never Used  . Alcohol use Yes     Comment: social     Allergies   Patient has no known allergies.   Review of Systems Review of Systems  Constitutional: Negative for chills and fever.  Respiratory:  Negative for shortness of breath.   Cardiovascular: Negative for chest pain.  Gastrointestinal: Positive for diarrhea. Negative for abdominal pain, nausea and vomiting.  Genitourinary: Negative for dysuria.  Musculoskeletal: Negative for myalgias.     Physical Exam Updated Vital Signs BP 123/86   Pulse 65   Temp 98.1 F (36.7 C) (Oral)   Resp 12   Ht 5\' 4"  (1.626 m)   Wt 114.3 kg   LMP 07/30/2016 (Exact Date)   SpO2 100%   BMI 43.26 kg/m   Physical Exam  Constitutional: She is oriented to person, place, and time. She appears well-developed and well-nourished.  HENT:  Head: Normocephalic.  Neck: Normal range of motion. Neck supple.  Cardiovascular: Normal rate and regular rhythm.   Pulmonary/Chest: Effort normal and breath sounds normal. She has no wheezes. She has no rales.  Abdominal: Soft. Bowel sounds are normal. She exhibits no distension. There is no tenderness. There is no rebound and no guarding.  Musculoskeletal: Normal range of motion.  Neurological: She is alert and oriented to person, place, and time.  Skin: Skin is warm and dry.  Psychiatric: She has a normal mood and affect.     ED Treatments / Results  Labs (all labs ordered are listed, but only abnormal results are displayed) Labs Reviewed  COMPREHENSIVE METABOLIC PANEL - Abnormal; Notable for the following:       Result Value   Glucose,  Bld 101 (*)    Creatinine, Ser 1.03 (*)    All other components within normal limits  CBC - Abnormal; Notable for the following:    WBC 11.1 (*)    All other components within normal limits  URINALYSIS, ROUTINE W REFLEX MICROSCOPIC - Abnormal; Notable for the following:    APPearance HAZY (*)    All other components within normal limits  LIPASE, BLOOD  I-STAT BETA HCG BLOOD, ED ([facility], [facility], [facility] ONLY)   Results for orders placed or performed during the hospital encounter of 09/03/16  Lipase, blood  Result Value Ref Range   Lipase 23 11 - 51 U/L  Comprehensive  metabolic panel  Result Value Ref Range   Sodium 138 135 - 145 mmol/L   Potassium 4.1 3.5 - 5.1 mmol/L   Chloride 105 101 - 111 mmol/L   CO2 23 22 - 32 mmol/L   Glucose, Bld 101 (H) 65 - 99 mg/dL   BUN 14 6 - 20 mg/dL   Creatinine, Ser 1.03 (H) 0.44 - 1.00 mg/dL   Calcium 8.9 8.9 - 10.3 mg/dL   Total Protein 7.2 6.5 - 8.1 g/dL   Albumin 3.7 3.5 - 5.0 g/dL   AST 31 15 - 41 U/L   ALT 22 14 - 54 U/L   Alkaline Phosphatase 62 38 - 126 U/L   Total Bilirubin 0.6 0.3 - 1.2 mg/dL   GFR calc non Af Amer >60 >60 mL/min   GFR calc Af Amer >60 >60 mL/min   Anion gap 10 5 - 15  CBC  Result Value Ref Range   WBC 11.1 (H) 4.0 - 10.5 K/uL   RBC 4.82 3.87 - 5.11 MIL/uL   Hemoglobin 13.0 12.0 - 15.0 g/dL   HCT 39.0 36.0 - 46.0 %   MCV 80.9 78.0 - 100.0 fL   MCH 27.0 26.0 - 34.0 pg   MCHC 33.3 30.0 - 36.0 g/dL   RDW 15.2 11.5 - 15.5 %   Platelets 250 150 - 400 K/uL  Urinalysis, Routine w reflex microscopic  Result Value Ref Range   Color, Urine YELLOW YELLOW   APPearance HAZY (A) CLEAR   Specific Gravity, Urine 1.021 1.005 - 1.030   pH 5.0 5.0 - 8.0   Glucose, UA NEGATIVE NEGATIVE mg/dL   Hgb urine dipstick NEGATIVE NEGATIVE   Bilirubin Urine NEGATIVE NEGATIVE   Ketones, ur NEGATIVE NEGATIVE mg/dL   Protein, ur NEGATIVE NEGATIVE mg/dL   Nitrite NEGATIVE NEGATIVE   Leukocytes, UA NEGATIVE NEGATIVE  I-Stat beta hCG blood, ED  Result Value Ref Range   I-stat hCG, quantitative <5.0 <5 mIU/mL   Comment 3            EKG  EKG Interpretation None       Radiology No results found.  Procedures Procedures (including critical care time)  Medications Ordered in ED Medications - No data to display   Initial Impression / Assessment and Plan / ED Course  I have reviewed the triage vital signs and the nursing notes.  Pertinent labs & imaging results that were available during my care of the patient were reviewed by me and considered in my medical decision making (see chart for  details).     Patient presents with loose stools x 4 days that are watery, non-bloody. No fever, or pain. She is well appearing here. No evidence dehydration. She can be discharged home with supportive care. Return precautions discussed.   Final Clinical Impressions(s) / ED Diagnoses  Final diagnoses:  None   1. Diarrhea, uncomplicated  New Prescriptions New Prescriptions   No medications on file     Charlann Lange, PA-C 09/03/16 [place], MD 09/03/16 [phone]

## 2016-09-03 NOTE — ED Notes (Signed)
Pt did not answer when called for rooming x 3

## 2016-09-04 ENCOUNTER — Emergency Department (HOSPITAL_COMMUNITY)
Admission: EM | Admit: 2016-09-04 | Discharge: 2016-09-04 | Discharge status: Against Medical Advice | Payer: Self-pay | Attending: Emergency Medicine | Admitting: Emergency Medicine

## 2016-09-04 DIAGNOSIS — M549 Dorsalgia, unspecified: Secondary | ICD-10-CM | POA: Insufficient documentation

## 2016-09-04 DIAGNOSIS — I10 Essential (primary) hypertension: Secondary | ICD-10-CM | POA: Insufficient documentation

## 2016-09-04 DIAGNOSIS — R451 Restlessness and agitation: Secondary | ICD-10-CM | POA: Insufficient documentation
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
## 2016-09-04 NOTE — ED Notes (Signed)
RN attempted to triage patient. Patient refused to be triaged and asked to speak with the Charge RN. Charge RN aware

## 2016-09-04 NOTE — ED Notes (Signed)
Pt was seen in by several staff members sitting in corner of waiting area for over 20 hours; Nurse first attempted to ask pt at 5 am if she was waiting for buses or if she had a ride; Pt ignored RN and security called for assistance; pt decided to check back in for back pain; after reregistering pt asked what the wait time was because she had an appointment; Pt was escorted to a room by NT and Police escort

## 2016-09-04 NOTE — ED Provider Notes (Signed)
[facility] DEPT Provider Note   CSN: [id number] Arrival date & time: 09/04/16  2353     History   Chief Complaint No chief complaint on file.  Level V caveat: uncooperative   HPI Emma Shepherd is a 51 y.o. female.  HPI Pt presented with complaints of recurrent neck and back pain but she if frustrated at this time with "the way I was treated by your staff", and will not provide any further information at this time. She is ambulatory. She reports she no longer wants to be in the ER and wants to "talk to the supervisor".  Its reported by nursing staff that she has been in the ER waiting room for over 12 hours.    Past Medical History:  Diagnosis Date  . Anxiety   . Hypertension 05/2013  . Obesity     Patient Active Problem List   Diagnosis Date Noted  . Pelvic pain 02/15/2016  . Severe obesity (BMI >= 40) ([place]) 02/15/2016  . Bipolar affective disorder, currently manic, severe, with psychotic features ([place]) 10/15/2014  . HTN (hypertension) 08/06/2014    No past surgical history on file.  OB History    No data available       Home Medications    Prior to Admission medications   Medication Sig Start Date End Date Taking? Authorizing Provider  hydrochlorothiazide (HYDRODIURIL) 25 MG tablet Take 1 tablet (25 mg total) by mouth daily. 02/15/16   Josalyn Funches, MD  naproxen (NAPROSYN) 500 MG tablet Take 1 tablet (500 mg total) by mouth 2 (two) times daily. 05/27/16   Domenic Moras, PA-C    Family History No family history on file.  Social History Social History  Substance Use Topics  . Smoking status: Never Smoker  . Smokeless tobacco: Never Used  . Alcohol use Yes     Comment: social     Allergies   Patient has no known allergies.   Review of Systems Review of Systems  Unable to perform ROS: Other     Physical Exam Updated Vital Signs There were no vitals taken for this visit.  Physical Exam  Constitutional: She appears well-developed and  well-nourished.  HENT:  Head: Normocephalic.  Eyes: EOM are normal.  Neck: Normal range of motion.  Pulmonary/Chest: Effort normal.  Abdominal: She exhibits no distension.  Musculoskeletal: Normal range of motion.  Moves all 4 extremities equally. Caring a back pack and large hand bag.   Neurological: She is alert.  No ataxia. Gait normal and quick  Psychiatric: Her affect is angry. She is agitated. She expresses no homicidal and no suicidal ideation.  Nursing note and vitals reviewed.    ED Treatments / Results  Labs (all labs ordered are listed, but only abnormal results are displayed) Labs Reviewed - No data to display  EKG  EKG Interpretation None       Radiology No results found.  Procedures Procedures (including critical care time)  Medications Ordered in ED Medications - No data to display   Initial Impression / Assessment and Plan / ED Course  I have reviewed the triage vital signs and the nursing notes.  Pertinent labs & imaging results that were available during my care of the patient were reviewed by me and considered in my medical decision making (see chart for details).     Pt no longer wants care in this ER.  I offered to here her history multiple times but she was focused on her experience and refused to provide  any hx or let me examine her.  I walked her to the front of the ER where she promptly left. I offered her to return to the ER for new or worsening symptoms or if she would like to provide her history and allow for an examination  Final Clinical Impressions(s) / ED Diagnoses   Final diagnoses:  None    New Prescriptions New Prescriptions   No medications on file     Jola Schmidt, MD 09/04/16 [phone]

## 2016-09-06 NOTE — Progress Notes (Addendum)
Security at [facility] ED on 4/25 asked CSW to speak to pt whom security said had been at the [facility] ED and [employer] for approx 2 days after her D/C. From [facility] ED.  CSW spoke to pt in the [facility] ED on 4/25 at approx 3:45pm.  Pt provided her name and age and said she had been D/C's without any place to go.  Pt said no shelter had opening currently and CSW verified this with the [employer] and [employer].  CSW called [facility] in [place] and was told pt could gain admittance on 4/26 if pt called on 4/26 at 8:45am.  CSW provided pt with a [place] bus pass and instructions for taking a [employer] bus, as well as a [employer] bus to [employer] on 4/26.  Pt was appreciative and thanked the CSW.  Pt has a valid ID, per the pt, as well as the money to purchase a [employer] bus pass.  Pt stated she would call in the morning to gain admittance to [facility] in [facility] and take the buses at that time from the [facility] ED.  CSW notified [facility] ED security and informed them of the pt's past admittance and D/C from [facility] ED and security stated he would be back in the morning to work and make sure pt left at 8:45am from the [facility] ED  after she had called.    CSW informed pt [facility] ED was aware she had D/C'd from [facility] ED and not from [facility] ED and that she would be expected to leave the [facility] ED in the morning to take the bus.  Please reconsult if future social work needs arise.    8:26 PM 4/25 CSW saw pt in cafeteria after pt was asked to remain in the ED by security until 4/26 and informed [facility] security.  Emma Shepherd. Emma Shepherd, Emma Shepherd, LCAS Clinical Social Worker Ph: [phone]

## 2016-09-07 ENCOUNTER — Encounter (HOSPITAL_COMMUNITY): Payer: Self-pay | Admitting: Emergency Medicine

## 2016-09-07 ENCOUNTER — Emergency Department (HOSPITAL_COMMUNITY): Payer: Self-pay

## 2016-09-07 ENCOUNTER — Emergency Department (HOSPITAL_COMMUNITY)
Admission: EM | Admit: 2016-09-07 | Discharge: 2016-09-07 | Disposition: A | Discharge status: Home / Self Care | Payer: Self-pay | Attending: Emergency Medicine | Admitting: Emergency Medicine

## 2016-09-07 DIAGNOSIS — K9089 Other intestinal malabsorption: Secondary | ICD-10-CM | POA: Insufficient documentation

## 2016-09-07 DIAGNOSIS — Z79899 Other long term (current) drug therapy: Secondary | ICD-10-CM | POA: Insufficient documentation

## 2016-09-07 DIAGNOSIS — K909 Intestinal malabsorption, unspecified: Secondary | ICD-10-CM

## 2016-09-07 DIAGNOSIS — R197 Diarrhea, unspecified: Secondary | ICD-10-CM

## 2016-09-07 DIAGNOSIS — I10 Essential (primary) hypertension: Secondary | ICD-10-CM | POA: Insufficient documentation

## 2016-09-07 DIAGNOSIS — N390 Urinary tract infection, site not specified: Secondary | ICD-10-CM | POA: Insufficient documentation

## 2016-09-07 LAB — URINALYSIS, ROUTINE W REFLEX MICROSCOPIC
BACTERIA UA: NONE SEEN
BILIRUBIN URINE: NEGATIVE
Glucose, UA: NEGATIVE mg/dL
Ketones, ur: NEGATIVE mg/dL
Leukocytes, UA: NEGATIVE
Nitrite: NEGATIVE
PROTEIN: 30 mg/dL — AB
SPECIFIC GRAVITY, URINE: 1.018 (ref 1.005–1.030)
pH: 5 (ref 5.0–8.0)

## 2016-09-07 LAB — CBC WITH DIFFERENTIAL/PLATELET
BASOS ABS: 0 10*3/uL (ref 0.0–0.1)
BASOS PCT: 0 %
Eosinophils Absolute: 0.1 10*3/uL (ref 0.0–0.7)
Eosinophils Relative: 2 %
HEMATOCRIT: 35.1 % — AB (ref 36.0–46.0)
HEMOGLOBIN: 11.4 g/dL — AB (ref 12.0–15.0)
Lymphocytes Relative: 28 %
Lymphs Abs: 2.1 10*3/uL (ref 0.7–4.0)
MCH: 26.5 pg (ref 26.0–34.0)
MCHC: 32.5 g/dL (ref 30.0–36.0)
MCV: 81.6 fL (ref 78.0–100.0)
MONOS PCT: 5 %
Monocytes Absolute: 0.4 10*3/uL (ref 0.1–1.0)
Neutro Abs: 4.8 10*3/uL (ref 1.7–7.7)
Neutrophils Relative %: 65 %
Platelets: 226 10*3/uL (ref 150–400)
RBC: 4.3 MIL/uL (ref 3.87–5.11)
RDW: 15.7 % — ABNORMAL HIGH (ref 11.5–15.5)
WBC: 7.4 10*3/uL (ref 4.0–10.5)

## 2016-09-07 LAB — COMPREHENSIVE METABOLIC PANEL
ALBUMIN: 3.4 g/dL — AB (ref 3.5–5.0)
ALT: 23 U/L (ref 14–54)
AST: 24 U/L (ref 15–41)
Alkaline Phosphatase: 59 U/L (ref 38–126)
Anion gap: 8 (ref 5–15)
BILIRUBIN TOTAL: 0.3 mg/dL (ref 0.3–1.2)
BUN: 12 mg/dL (ref 6–20)
CALCIUM: 8.5 mg/dL — AB (ref 8.9–10.3)
CO2: 25 mmol/L (ref 22–32)
CREATININE: 0.64 mg/dL (ref 0.44–1.00)
Chloride: 105 mmol/L (ref 101–111)
GFR calc Af Amer: >60 mL/min (ref >60)
GFR calc non Af Amer: >60 mL/min (ref >60)
GLUCOSE: 98 mg/dL (ref 65–99)
Potassium: 4 mmol/L (ref 3.5–5.1)
Sodium: 138 mmol/L (ref 135–145)
TOTAL PROTEIN: 7 g/dL (ref 6.5–8.1)

## 2016-09-07 LAB — LIPASE, BLOOD: Lipase: 32 U/L (ref 11–51)

## 2016-09-07 LAB — MAGNESIUM: MAGNESIUM: 1.7 mg/dL (ref 1.7–2.4)

## 2016-09-07 LAB — I-STAT BETA HCG BLOOD, ED (MC, WL, AP ONLY)

## 2016-09-07 LAB — RAPID HIV SCREEN (HIV 1/2 AB+AG)
HIV 1/2 Antibodies: NONREACTIVE
HIV-1 P24 ANTIGEN - HIV24: NONREACTIVE

## 2016-09-07 MED ORDER — CEPHALEXIN 500 MG PO CAPS
500.0000 mg | ORAL_CAPSULE | Freq: Once | ORAL | Status: AC
  Administered 2016-09-07: 500 mg via ORAL
  Filled 2016-09-07: qty 1

## 2016-09-07 MED ORDER — IOPAMIDOL (ISOVUE-300) INJECTION 61%
100.0000 mL | Freq: Once | INTRAVENOUS | Status: AC | PRN
  Administered 2016-09-07: 100 mL via INTRAVENOUS

## 2016-09-07 MED ORDER — CEPHALEXIN 500 MG PO CAPS: 500.0000 mg | ORAL_CAPSULE | Freq: Three times a day (TID) | ORAL | 0 refills | Status: AC

## 2016-09-07 MED ORDER — SODIUM CHLORIDE 0.9 % IV BOLUS (SEPSIS)
1000.0000 mL | Freq: Once | INTRAVENOUS | Status: AC
  Administered 2016-09-07: 1000 mL via INTRAVENOUS

## 2016-09-07 MED ORDER — IOPAMIDOL (ISOVUE-300) INJECTION 61%
INTRAVENOUS | Status: AC
  Filled 2016-09-07: qty 100

## 2016-09-07 MED ORDER — LOPERAMIDE HCL 2 MG PO CAPS: 2.0000 mg | ORAL_CAPSULE | Freq: Four times a day (QID) | ORAL | 0 refills | Status: DC | PRN

## 2016-09-07 NOTE — ED Provider Notes (Signed)
[facility] DEPT Provider Note   CSN: [id number] Arrival date & time: 09/07/16  0507     History   Chief Complaint Chief Complaint  Patient presents with  . Diarrhea    HPI Emma Shepherd is a 51 y.o. female.  HPI 51 yo F with PMHx as below including obesity, bipolar d/o, homelessness here with diarrhea. Pt states that for the past 1.5 weeks, she has had significant watery, profuse diarrhea with intermittent lower abdominal pain. Pain is an aching, gnawing, "stabbing" pain that is worse with palpation and eating/drinking. No fevers. No dysuria but has had decreased UOP. She was previously evaluated for this and advised dietary changes but she has been unable to do this as she is homeless. Unknown sick contacts but she may have been around some sick people according to her report. No alleviating factors.   Past Medical History:  Diagnosis Date  . Anxiety   . Hypertension 05/2013  . Obesity     Patient Active Problem List   Diagnosis Date Noted  . Pelvic pain 02/15/2016  . Severe obesity (BMI >= 40) ([place]) 02/15/2016  . Bipolar affective disorder, currently manic, severe, with psychotic features ([place]) 10/15/2014  . HTN (hypertension) 08/06/2014    History reviewed. No pertinent surgical history.  OB History    No data available       Home Medications    Prior to Admission medications   Medication Sig Start Date End Date Taking? Authorizing Provider  ibuprofen (ADVIL,MOTRIN) 200 MG tablet Take 400 mg by mouth every 6 (six) hours as needed for moderate pain.   Yes Historical Provider, MD  cephALEXin (KEFLEX) 500 MG capsule Take 1 capsule (500 mg total) by mouth 3 (three) times daily. 09/07/16 09/12/16  Duffy Bruce, MD  hydrochlorothiazide (HYDRODIURIL) 25 MG tablet Take 1 tablet (25 mg total) by mouth daily. Patient not taking: Reported on 09/07/2016 02/15/16   Boykin Nearing, MD  loperamide (IMODIUM) 2 MG capsule Take 1 capsule (2 mg total) by mouth 4 (four) times  daily as needed for diarrhea or loose stools. 09/07/16   Duffy Bruce, MD  naproxen (NAPROSYN) 500 MG tablet Take 1 tablet (500 mg total) by mouth 2 (two) times daily. Patient not taking: Reported on 09/07/2016 05/27/16   Domenic Moras, PA-C    Family History History reviewed. No pertinent family history.  Social History Social History  Substance Use Topics  . Smoking status: Never Smoker  . Smokeless tobacco: Never Used  . Alcohol use Yes     Comment: social     Allergies   Patient has no known allergies.   Review of Systems Review of Systems  Constitutional: Positive for appetite change, fatigue and unexpected weight change. Negative for chills and fever.  HENT: Negative for congestion, rhinorrhea and sore throat.   Eyes: Negative for visual disturbance.  Respiratory: Negative for cough, shortness of breath and wheezing.   Cardiovascular: Negative for chest pain and leg swelling.  Gastrointestinal: Positive for abdominal pain, diarrhea and nausea. Negative for vomiting.  Genitourinary: Negative for dysuria, flank pain, vaginal bleeding and vaginal discharge.  Musculoskeletal: Negative for neck pain.  Skin: Negative for rash.  Allergic/Immunologic: Negative for immunocompromised state.  Neurological: Negative for syncope and headaches.  Hematological: Does not bruise/bleed easily.  All other systems reviewed and are negative.    Physical Exam Updated Vital Signs BP 121/72 (BP Location: Left Arm)   Pulse 73   Temp 98.9 F (37.2 C) (Oral)   Resp 15  Ht 5\' 4"  (1.626 m)   Wt 252 lb (114.3 kg)   LMP 09/05/2016 (Approximate) Comment: neg preg 09-07-16  SpO2 100%   BMI 43.26 kg/m   Physical Exam  Constitutional: She is oriented to person, place, and time. She appears well-developed and well-nourished. No distress.  HENT:  Head: Normocephalic and atraumatic.  Eyes: Conjunctivae are normal.  Neck: Neck supple.  Cardiovascular: Normal rate, regular rhythm and normal  heart sounds.  Exam reveals no friction rub.   No murmur heard. Pulmonary/Chest: Effort normal and breath sounds normal. No respiratory distress. She has no wheezes. She has no rales.  Abdominal: Soft. She exhibits no distension. There is tenderness (mild, lower quadrants bilaterally). There is no rebound and no guarding.  Musculoskeletal: She exhibits no edema.  Neurological: She is alert and oriented to person, place, and time. She exhibits normal muscle tone.  Skin: Skin is warm. Capillary refill takes less than 2 seconds.  Psychiatric: She has a normal mood and affect.  Nursing note and vitals reviewed.    ED Treatments / Results  Labs (all labs ordered are listed, but only abnormal results are displayed) Labs Reviewed  CBC WITH DIFFERENTIAL/PLATELET - Abnormal; Notable for the following:       Result Value   Hemoglobin 11.4 (*)    HCT 35.1 (*)    RDW 15.7 (*)    All other components within normal limits  COMPREHENSIVE METABOLIC PANEL - Abnormal; Notable for the following:    Calcium 8.5 (*)    Albumin 3.4 (*)    All other components within normal limits  URINALYSIS, ROUTINE W REFLEX MICROSCOPIC - Abnormal; Notable for the following:    APPearance CLOUDY (*)    Hgb urine dipstick LARGE (*)    Protein, ur 30 (*)    Squamous Epithelial / LPF 0-5 (*)    All other components within normal limits  URINE CULTURE  LIPASE, BLOOD  MAGNESIUM  RAPID HIV SCREEN (HIV 1/2 AB+AG)  I-STAT BETA HCG BLOOD, ED ([facility], [facility], [facility] ONLY)    EKG  EKG Interpretation None       Radiology Ct Abdomen Pelvis W Contrast  Result Date: 09/07/2016 CLINICAL DATA:  Left lower quadrant pain and diarrhea EXAM: CT ABDOMEN AND PELVIS WITH CONTRAST TECHNIQUE: Multidetector CT imaging of the abdomen and pelvis was performed using the standard protocol following bolus administration of intravenous contrast. CONTRAST:  186mL ISOVUE-300 IOPAMIDOL (ISOVUE-300) INJECTION 61% COMPARISON:  None. FINDINGS: Lower  chest: No acute abnormality. Hepatobiliary: No focal liver abnormality is seen. No gallstones, gallbladder wall thickening, or biliary dilatation. Pancreas: Somewhat limited by patient motion artifact although no focal abnormality is seen. Spleen: Normal in size without focal abnormality. Adrenals/Urinary Tract: Tiny less than 1 cm hypodensity is noted in the lower pole of the right kidney. This likely represents a small cyst but is too small for adequate characterization. Normal excretion is noted bilaterally. Stomach/Bowel: Stomach is within normal limits. Appendix appears normal. No evidence of bowel wall thickening, distention, or inflammatory changes. Vascular/Lymphatic: No significant vascular findings are present. No enlarged abdominal or pelvic lymph nodes. Reproductive: Uterus and bilateral adnexa are unremarkable. Other: No abdominal wall hernia or abnormality. No abdominopelvic ascites. Musculoskeletal: No acute or significant osseous findings. IMPRESSION: Tiny hypodensity in the right kidney likely representing a cyst. No acute abnormality noted. Electronically Signed   By: Inez Catalina M.D.   On: 09/07/2016 10:07    Procedures Procedures (including critical care time)  Medications Ordered in ED  Medications  sodium chloride 0.9 % bolus 1,000 mL (0 mLs Intravenous Stopped 09/07/16 1039)  iopamidol (ISOVUE-300) 61 % injection 100 mL (100 mLs Intravenous Contrast Given 09/07/16 0930)  cephALEXin (KEFLEX) capsule 500 mg (500 mg Oral Given 09/07/16 1039)     Initial Impression / Assessment and Plan / ED Course  I have reviewed the triage vital signs and the nursing notes.  Pertinent labs & imaging results that were available during my care of the patient were reviewed by me and considered in my medical decision making (see chart for details).     51 yo F with PMH homelessness here with ongoing diarrhea x 1.5 weeks. Suspect this may be 2/2 malnutrition, poor diet due to poor social situation.  She is currently planning on presenting to a shelter. Lab work, CT is unremarkable with no focal findings to suggest diverticulitis or significant colitis or appendicitis. She has no recent ABX use, normal WBC, and I do not suspect C. Diff. She is tolerating PO without difficulty. Her UA does show some mild pyuria so will tx. No flank pain or signs of pyelo. Will d/c with supportive care and outpt f/u. Return precautions given.  Final Clinical Impressions(s) / ED Diagnoses   Final diagnoses:  Diarrhea due to malabsorption  Lower urinary tract infection, acute    New Prescriptions Discharge Medication List as of 09/07/2016 10:41 AM    START taking these medications   Details  cephALEXin (KEFLEX) 500 MG capsule Take 1 capsule (500 mg total) by mouth 3 (three) times daily., Starting Thu 09/07/2016, Until Tue 09/12/2016, Print    loperamide (IMODIUM) 2 MG capsule Take 1 capsule (2 mg total) by mouth 4 (four) times daily as needed for diarrhea or loose stools., Starting Thu 09/07/2016, Print         Duffy Bruce, MD 09/07/16 [phone]

## 2016-09-07 NOTE — ED Triage Notes (Signed)
Pt states she has had diarrhea for the past 7 to 10 days  Pt has nausea without vomiting

## 2016-09-07 NOTE — ED Notes (Signed)
Patient transported to CT 

## 2016-09-07 NOTE — ED Notes (Signed)
Date and time results received: 09/07/16  (use smartphrase ".now" to insert current time)  Test: HIV Critical Value: NON REACTIVE  Name of Provider Notified: EDP ISSACS  Orders Received? Or Actions Taken?: Actions Taken: NO ORDERS

## 2016-09-08 LAB — URINE CULTURE: Special Requests: NORMAL

## 2016-09-27 ENCOUNTER — Encounter: Payer: Self-pay | Admitting: Family Medicine

## 2016-12-12 ENCOUNTER — Ambulatory Visit: Payer: Self-pay | Attending: Family Medicine | Admitting: Family Medicine

## 2016-12-12 ENCOUNTER — Ambulatory Visit: Payer: Self-pay | Admitting: Licensed Clinical Social Worker

## 2016-12-12 ENCOUNTER — Encounter: Payer: Self-pay | Admitting: Family Medicine

## 2016-12-12 VITALS — BP 154/86 | HR 70 | Temp 98.3°F | Ht 64.0 in | Wt 254.6 lb

## 2016-12-12 DIAGNOSIS — Z5900 Homelessness unspecified: Secondary | ICD-10-CM

## 2016-12-12 DIAGNOSIS — R350 Frequency of micturition: Secondary | ICD-10-CM | POA: Insufficient documentation

## 2016-12-12 DIAGNOSIS — E669 Obesity, unspecified: Secondary | ICD-10-CM | POA: Insufficient documentation

## 2016-12-12 DIAGNOSIS — R7303 Prediabetes: Secondary | ICD-10-CM

## 2016-12-12 DIAGNOSIS — M7989 Other specified soft tissue disorders: Secondary | ICD-10-CM | POA: Insufficient documentation

## 2016-12-12 DIAGNOSIS — Z79899 Other long term (current) drug therapy: Secondary | ICD-10-CM | POA: Insufficient documentation

## 2016-12-12 DIAGNOSIS — N924 Excessive bleeding in the premenopausal period: Secondary | ICD-10-CM

## 2016-12-12 DIAGNOSIS — G8929 Other chronic pain: Secondary | ICD-10-CM | POA: Insufficient documentation

## 2016-12-12 DIAGNOSIS — M5442 Lumbago with sciatica, left side: Secondary | ICD-10-CM | POA: Insufficient documentation

## 2016-12-12 DIAGNOSIS — N920 Excessive and frequent menstruation with regular cycle: Secondary | ICD-10-CM | POA: Insufficient documentation

## 2016-12-12 DIAGNOSIS — B353 Tinea pedis: Secondary | ICD-10-CM | POA: Insufficient documentation

## 2016-12-12 DIAGNOSIS — I10 Essential (primary) hypertension: Secondary | ICD-10-CM | POA: Insufficient documentation

## 2016-12-12 LAB — POCT URINALYSIS DIPSTICK
Blood, UA: NEGATIVE
Glucose, UA: NEGATIVE
KETONES UA: NEGATIVE
Leukocytes, UA: NEGATIVE
NITRITE UA: NEGATIVE
PH UA: 7 (ref 5.0–8.0)
Protein, UA: 30
SPEC GRAV UA: 1.02 (ref 1.010–1.025)
UROBILINOGEN UA: 0.2 U/dL

## 2016-12-12 LAB — GLUCOSE, POCT (MANUAL RESULT ENTRY): POC Glucose: 87 mg/dl (ref 70–99)

## 2016-12-12 MED ORDER — HYDROCHLOROTHIAZIDE 12.5 MG PO TABS: 12.5000 mg | ORAL_TABLET | Freq: Every day | ORAL | 3 refills | Status: DC

## 2016-12-12 MED ORDER — NAPROXEN 500 MG PO TABS: 500.0000 mg | ORAL_TABLET | Freq: Two times a day (BID) | ORAL | 1 refills | Status: DC

## 2016-12-12 MED ORDER — PREDNISONE 10 MG PO TABS: ORAL_TABLET | ORAL | 0 refills | Status: DC

## 2016-12-12 NOTE — Progress Notes (Signed)
Subjective:  Patient ID: Emma Shepherd, female    DOB: 1966/03/17  Age: 51 y.o. MRN: [id number]  CC: Abdominal Pain and Back Pain   HPI Emma Shepherd has obesity, HTN, homelessness and menorrhagia she presents for   1. Back pain:  Since 08/2016. Bilateral low back. Worsening pain on L sided. Pain radiating down the left leg. She has history of hospitalization of 05/2009 for 2 weeks for slipped disc. She reports sensation of her low back giving out when she rises for kneeling or squatting. This often occurs sometimes with prolonged walking. This occurs with increased activity. When she takes aleve for pain it does help. She reports chronic numbness in toes on left foot 3rd-5th. She has burning and tingling sensation on the bottom of her right foot.   2.  Heavy menses with severe cramping: since 07/2013. She gives history of amenorrhea from 2003-2011 with IUD placement.  The IUD was removed in 02/2010. Her period returned in 05/2010.  Her periods last for 3 days usually. She has severe and heavy bleeding during this time. She has cramping. She has increase in chronic back pain. She denies nausea or emesis.  She is currently not bleeding Her LMP was 10/11/16.  She completed pelvic and TVUS on 02/2016 that revealed a uterine fibroid and no endometrial abnormality.   3. HTN: she rarely takes HCTZ. She has some leg swelling. She is homeless and relies on shelters to eat. She reports eating a lot of fried chicken. She walks often.   4. Homelessness: she has been on the the streets since 09/01/2016. S  Social History  Substance Use Topics  . Smoking status: Never Smoker  . Smokeless tobacco: Never Used  . Alcohol use Yes     Comment: social    Outpatient Medications Prior to Visit  Medication Sig Dispense Refill  . naproxen (NAPROSYN) 500 MG tablet Take 1 tablet (500 mg total) by mouth 2 (two) times daily. 30 tablet 0  . hydrochlorothiazide (HYDRODIURIL) 25 MG tablet Take 1 tablet (25 mg  total) by mouth daily. (Patient not taking: Reported on 09/07/2016) 90 tablet 3  . ibuprofen (ADVIL,MOTRIN) 200 MG tablet Take 400 mg by mouth every 6 (six) hours as needed for moderate pain.    Marland Kitchen loperamide (IMODIUM) 2 MG capsule Take 1 capsule (2 mg total) by mouth 4 (four) times daily as needed for diarrhea or loose stools. (Patient not taking: Reported on 12/12/2016) 12 capsule 0   No facility-administered medications prior to visit.     ROS Review of Systems  Constitutional: Negative for chills and fever.  Eyes: Negative for visual disturbance.  Respiratory: Negative for shortness of breath.   Cardiovascular: Negative for chest pain.  Gastrointestinal: Negative for abdominal pain and blood in stool.  Genitourinary: Positive for frequency and menstrual problem.  Musculoskeletal: Positive for arthralgias and back pain.  Skin: Negative for rash.  Allergic/Immunologic: Negative for immunocompromised state.  Hematological: Negative for adenopathy. Does not bruise/bleed easily.  Psychiatric/Behavioral: Negative for dysphoric mood and suicidal ideas.    Objective:  BP (!) 154/86   Pulse 70   Temp 98.3 F (36.8 C) (Oral)   Ht _0  (1.626 m)   Wt 254 lb 9.6 oz (115.5 kg)   SpO2 97%   BMI 43.70 kg/m   BP/Weight 12/12/2016 09/07/2016 3/97/6734  Systolic BP 193 790 240  Diastolic BP 86 72 85  Wt. (Lbs) 254.6 252 -  BMI 43.7 43.26 43.26     Physical  Exam  Constitutional: She is oriented to person, place, and time. She appears well-developed and well-nourished. No distress.  Obese   HENT:  Head: Normocephalic and atraumatic.  Cardiovascular: Normal rate, regular rhythm, normal heart sounds and intact distal pulses.   Pulmonary/Chest: Effort normal and breath sounds normal.  Musculoskeletal: She exhibits edema (trace b/l LE ).       Feet:  Back Exam: Back: Normal Curvature, no deformities or CVA tenderness  Paraspinal Tenderness: left lumbar   LE Strength 5/5  LE Sensation:  in tact  LE Reflexes 2+ and symmetric  Straight leg raise: negative     Neurological: She is alert and oriented to person, place, and time.  Skin: Skin is warm and dry. No rash noted.  Psychiatric: She has a normal mood and affect.   UA: normal except for 30 protein  Lab Results  Component Value Date   HGBA1C 5.7 (H) 12/12/2016   CBG 87   Lab Results  Component Value Date   WBC 7.1 12/12/2016   HGB 13.9 12/12/2016   HCT 42.0 12/12/2016   MCV 83 12/12/2016   PLT 281 12/12/2016     Chemistry      Component Value Date/Time   NA 141 12/12/2016 1024   K 4.8 12/12/2016 1024   CL 104 12/12/2016 1024   CO2 23 12/12/2016 1024   BUN 12 12/12/2016 1024   CREATININE 0.72 12/12/2016 1024      Component Value Date/Time   CALCIUM 8.8 12/12/2016 1024   ALKPHOS 66 12/12/2016 1024   AST 16 12/12/2016 1024   ALT 12 12/12/2016 1024   BILITOT 0.5 12/12/2016 1024      Assessment & Plan:  Emma Shepherd was seen today for abdominal pain and back pain.  Diagnoses and all orders for this visit:  Urinary frequency -     POCT urinalysis dipstick -     Glucose (CBG)  Excessive bleeding in premenopausal period -     Discontinue: naproxen (NAPROSYN) 500 MG tablet; Take 1 tablet (500 mg total) by mouth 2 (two) times daily. Start the day before your period for first 2 days of period -     CBC -     naproxen (NAPROSYN) 500 MG tablet; Take 1 tablet (500 mg total) by mouth 2 (two) times daily. Start the day before your period for first 2 days of period  Chronic left-sided low back pain with left-sided sciatica -     predniSONE (DELTASONE) 10 MG tablet; Take by mouth 4 tablets for 2 days, 3 tablets for 2 days, 2 tablets for 2 days, 1 tablet for 3 days then STOP  Essential hypertension -     Hemoglobin A1c -     hydrochlorothiazide (HYDRODIURIL) 12.5 MG tablet; Take 1 tablet (12.5 mg total) by mouth daily. -     CMP14+EGFR -     Lipid Panel  Tinea pedis of both feet -     miconazole (MICOTIN)  2 % powder; Apply topically as needed for itching.  There are no diagnoses linked to this encounter.  No orders of the defined types were placed in this encounter.   Follow-up: No Follow-up on file.   Boykin Nearing MD

## 2016-12-12 NOTE — Patient Instructions (Addendum)
Emma Shepherd was seen today for abdominal pain and back pain.  Diagnoses and all orders for this visit:  Urinary frequency -     POCT urinalysis dipstick -     Glucose (CBG)  Excessive bleeding in premenopausal period -     naproxen (NAPROSYN) 500 MG tablet; Take 1 tablet (500 mg total) by mouth 2 (two) times daily. Start the day before your period for first 2 days of period -     CBC  Chronic left-sided low back pain with left-sided sciatica -     predniSONE (DELTASONE) 10 MG tablet; Take by mouth 4 tablets for 2 days, 3 tablets for 2 days, 2 tablets for 2 days, 1 tablet for 3 days then STOP  Essential hypertension -     Hemoglobin A1c -     hydrochlorothiazide (HYDRODIURIL) 12.5 MG tablet; Take 1 tablet (12.5 mg total) by mouth daily. -     CMP14+EGFR -     Lipid Panel   Your blood sugar is normal.  Urinalysis is normal except for a bit of protein. This can be due to high blood pressure. I recommend restarting HCTZ at 12.5 mg daily.  Checking A1c, CBC, CMP and cholesterol on send out blood work.   F/u in 4 weeks for BP recheck and f/u response to prednisone  Dr. Adrian Blackwater    Menorrhagia Menorrhagia is a condition in which menstrual periods are heavy or last longer than normal. With menorrhagia, most periods a woman has may cause enough blood loss and cramping that she becomes unable to take part in her usual activities. What are the causes? Common causes of this condition include:  Noncancerous growths in the uterus (polyps or fibroids).  An imbalance of the estrogen and progesterone hormones.  One of the ovaries not releasing an egg during one or more months.  A problem with the thyroid gland (hypothyroid).  Side effects of having an intrauterine device (IUD).  Side effects of some medicines, such as anti-inflammatory medicines or blood thinners.  A bleeding disorder that stops the blood from clotting normally.  In some cases, the cause of this condition is not  known. What are the signs or symptoms? Symptoms of this condition include:  Routinely having to change your pad or tampon every 1-2 hours because it is completely soaked.  Needing to use pads and tampons at the same time because of heavy bleeding.  Needing to wake up to change your pads or tampons during the night.  Passing blood clots larger than 1 inch (2.5 cm) in size.  Having bleeding that lasts for more than 7 days.  Having symptoms of low iron levels (anemia), such as tiredness, fatigue, or shortness of breath.  How is this diagnosed? This condition may be diagnosed based on:  A physical exam.  Your symptoms and menstrual history.  Tests, such as: ? Blood tests to check if you are pregnant or have hormonal changes, a bleeding or thyroid disorder, anemia, or other problems. ? Pap test to check for cancerous changes, infections, or inflammation. ? Endometrial biopsy. This test involves removing a tissue sample from the lining of the uterus (endometrium) to be examined under a microscope. ? Pelvic ultrasound. This test uses sound waves to create images of your uterus, ovaries, and vagina. The images can show if you have fibroids or other growths. ? Hysteroscopy. For this test, a small telescope is used to look inside your uterus.  How is this treated? Treatment may not be needed  for this condition. If it is needed, the best treatment for you will depend on:  Whether you need to prevent pregnancy.  Your desire to have children in the future.  The cause and severity of your bleeding.  Your personal preference.  Medicines are the first step in treatment. You may be treated with:  Hormonal birth control methods. These treatments reduce bleeding during your menstrual period. They include: ? Birth control pills. ? Skin patch. ? Vaginal ring. ? Shots (injections) that you get every 3 months. ? Hormonal IUD (intrauterine device). ? Implants that go under the  skin.  Medicines that thicken blood and slow bleeding.  Medicines that reduce swelling, such as ibuprofen.  Medicines that contain an artificial (synthetic) hormone called progestin.  Medicines that make the ovaries stop working for a short time.  Iron supplements to treat anemia.  If medicines do not work, surgery may be done. Surgical options may include:  Dilation and curettage (D&C). In this procedure, your health care provider opens (dilates) your cervix and then scrapes or suctions tissue from the endometrium to reduce menstrual bleeding.  Operative hysteroscopy. In this procedure, a small tube with a light on the end (hysteroscope) is used to view your uterus and help remove polyps that may be causing heavy periods.  Endometrial ablation. This is when various techniques are used to permanently destroy your entire endometrium. After endometrial ablation, most women have little or no menstrual flow. This procedure reduces your ability to become pregnant.  Endometrial resection. In this procedure, an electrosurgical wire loop is used to remove the endometrium. This procedure reduces your ability to become pregnant.  Hysterectomy. This is surgical removal of the uterus. This is a permanent procedure that stops menstrual periods. Pregnancy is not possible after a hysterectomy.  Follow these instructions at home: Medicines  Take over-the-counter and prescription medicines exactly as told by your health care provider. This includes iron pills.  Do not change or switch medicines without asking your health care provider.  Do not take aspirin or medicines that contain aspirin 1 week before or during your menstrual period. Aspirin may make bleeding worse. General instructions  If you need to change your sanitary pad or tampon more than once every 2 hours, limit your activity until the bleeding stops.  Iron pills can cause constipation. To prevent or treat constipation while you are  taking prescription iron supplements, your health care provider may recommend that you: ? Drink enough fluid to keep your urine clear or pale yellow. ? Take over-the-counter or prescription medicines. ? Eat foods that are high in fiber, such as fresh fruits and vegetables, whole grains, and beans. ? Limit foods that are high in fat and processed sugars, such as fried and sweet foods.  Eat well-balanced meals, including foods that are high in iron. Foods that have a lot of iron include leafy green vegetables, meat, liver, eggs, and whole grain breads and cereals.  Do not try to lose weight until the abnormal bleeding has stopped and your blood iron level is back to normal. If you need to lose weight, work with your health care provider to lose weight safely.  Keep all follow-up visits as told by your health care provider. This is important. Contact a health care provider if:  You soak through a pad or tampon every 1 or 2 hours, and this happens every time you have a period.  You need to use pads and tampons at the same time because you are  bleeding so much.  You have nausea, vomiting, diarrhea, or other problems related to medicines you are taking. Get help right away if:  You soak through more than a pad or tampon in 1 hour.  You pass clots bigger than 1 inch (2.5 cm) wide.  You feel short of breath.  You feel like your heart is beating too fast.  You feel dizzy or faint.  You feel very weak or tired. Summary  Menorrhagia is a condition in which menstrual periods are heavy or last longer than normal.  Treatment will depend on the cause of the condition and may include medicines or procedures.  Take over-the-counter and prescription medicines exactly as told by your health care provider. This includes iron pills.  Get help right away if you have heavy bleeding that soaks through more than a pad or tampon in 1 hour, you are passing large clots, or you feel dizzy, faint or short  of breath. This information is not intended to replace advice given to you by your health care provider. Make sure you discuss any questions you have with your health care provider. Document Released: 05/01/2005 Document Revised: 04/24/2016 Document Reviewed: 04/24/2016 Elsevier Interactive Patient Education  2017 Else[employer] Inc[employer]   Piriformis Syndrome Piriformis syndrome is a condition that can cause pain and numbness in your buttocks and down the back of your leg. Piriformis syndrome happens when the small muscle that connects the base of your spine to your hip (piriformis muscle) presses on the nerve that runs down the back of your leg (sciatic nerve). The piriformis muscle helps your hip rotate and helps to bring your leg back and out. It also helps shift your weight while you are walking to keep you stable. The sciatic nerve runs under or through the piriformis. Damage to the piriformis muscle can cause spasms that put pressure on the nerve below. This causes pain and discomfort while sitting and moving. The pain may feel as if it begins in the buttock and spreads (radiates) down your hip and thigh. What are the causes? This condition is caused by pressure on the sciatic nerve from the piriformis muscle. The piriformis muscle can get irritated with overuse, especially if other hip muscles are weak and the piriformis has to do extra work. Piriformis syndrome can also occur after an injury, like a fall onto your buttocks. What increases the risk? This condition is more likely to develop in:  Women.  People who sit for long periods of time.  Cyclists.  People who have weak buttocks muscles (gluteal muscles).  What are the signs or symptoms? Pain, tingling, or numbness that starts in the buttock and runs down the back of your leg (sciatica) is the most common symptom of this condition. Your symptoms may:  Get worse the longer you sit.  Get worse when you walk, run, or go up on  stairs.  How is this diagnosed? This condition is diagnosed based on your symptoms, medical history, and physical exam. During this exam, your health care provider may move your leg into different positions to check for pain. He or she will also press on the muscles of your hip and buttock to see if that increases your symptoms. You may also have an X-ray or MRI. How is this treated? Treatment for this condition may include:  Stopping all activities that cause pain or make your condition worse.  Using heat or ice to relieve pain as told by your health care provider.  Taking medicines  to reduce pain and swelling.  Taking a muscle relaxer to release the piriformis muscle.  Doing range-of-motion and strengthening exercises (physical therapy) as told by your health care provider.  Massaging the affected area.  Getting an injection of an anti-inflammatory medicine or muscle relaxer to reduce inflammation and muscle tension.  In rare cases, you may need surgery to cut the muscle and release pressure on the nerve if other treatments do not work. Follow these instructions at home:  Take over-the-counter and prescription medicines only as told by your health care provider.  Do not sit for long periods. Get up and walk around every 20 minutes or as often as told by your health care provider.  If directed, apply heat to the affected area as often as told by your health care provider. Use the heat source that your health care provider recommends, such as a moist heat pack or a heating pad. ? Place a towel between your skin and the heat source. ? Leave the heat on for 20-30 minutes. ? Remove the heat if your skin turns bright red. This is especially important if you are unable to feel pain, heat, or cold. You may have a greater risk of getting burned.  If directed, apply ice to the injured area. ? Put ice in a plastic bag. ? Place a towel between your skin and the bag. ? Leave the ice on for  20 minutes, 2-3 times a day.  Do exercises as told by your health care provider.  Return to your normal activities as told by your health care provider. Ask your health care provider what activities are safe for you.  Keep all follow-up visits as told by your health care provider. This is important. How is this prevented?  Do not sit for longer than 20 minutes at a time. When you sit, choose padded surfaces.  Warm up and stretch before being active.  Cool down and stretch after being active.  Give your body time to rest between periods of activity.  Make sure to use equipment that fits you.  Maintain physical fitness, including: ? Strength. ? Flexibility. Contact a health care provider if:  Your pain and stiffness continue or get worse.  Your leg or hip becomes weak.  You have changes in your bowel function or bladder function. This information is not intended to replace advice given to you by your health care provider. Make sure you discuss any questions you have with your health care provider. Document Released: 05/01/2005 Document Revised: 01/04/2016 Document Reviewed: 04/13/2015 Elsevier Interactive Patient Education  [employer].

## 2016-12-12 NOTE — BH Specialist Note (Signed)
Integrated Behavioral Health Initial Visit  MRN: [id number] Name: Emma Shepherd   Session Start time: 9:45 AM Session End time: 10:15 AM Total time: 30 minutes  Type of Service: [place] Interpretor:No. Interpretor Name and Language: N/A   Warm Hand Off Completed.       SUBJECTIVE: Rovena Hearld is a 51 y.o. female accompanied by patient. Patient was referred by Dr. Adrian Blackwater for homelessness. Patient reports the following symptoms/concerns: low energy, difficulty sleeping Duration of problem: 4 months; Severity of problem: moderate  OBJECTIVE: Mood: Pleasant and Affect: Appropriate Risk of harm to self or others: No plan to harm self or others   LIFE CONTEXT: Family and Social: Pt reports strained relationship with family resulting in no current relationship. Pt is connected with community agencies (i.e. [employer], churches, etc) School/Work: Pt is unemployed and currently looking for employment. She receives food stamps [phone]) Self-Care: Pt engages in meditation and prayer on a routine basis. She walks often and tries to eat a balanced diet Life Changes: Pt has been homeless since 09/01/16. She is looking for employment to assist in obtaining stable housing  GOALS ADDRESSED: Patient will reduce symptoms of: stress and increase knowledge and/or ability of: coping skills and also: Increase adequate support systems for patient/family   INTERVENTIONS: Solution-Focused Strategies, Supportive Counseling, Psychoeducation and/or Health Education and Link to [employer]  Standardized Assessments completed: None reported  ASSESSMENT: Patient currently experiencing stress triggered by current homelessness. She reports difficulty sleeping and low energy. Patient has no support from family or friends. She is knowledgeable of community agencies to assist with basic needs and employment opportunities (i.e. Museum/gallery curator, [place] that provide free meals) Patient may benefit from psychotherapy. [place] educated pt on how stress can negatively impact one's mental and physical health. Pt successfully identified healthy coping skills to utilize daily to cope with stressors. Pt was provided bus passes, an employment packet, and was strongly encouraged to complete Financial Counseling.   PLAN: 1. Follow up with behavioral health clinician on : Pt was encouraged to contact [facility] if symptoms worsen or fail to improve to schedule behavioral appointments at [facility]. 2. Behavioral recommendations: [facility] recommends that pt apply healthy coping skills discussed and utilize provided resources. Pt is encouraged to schedule follow up appointment with [facility] 3. Referral(s): [employer] Resources:  Haematologist, Financial trader 4. "From scale of 1-10, how likely are you to follow plan?": 8/10  Rebekah Chesterfield, LCSW 12/14/16 9:21 AM

## 2016-12-13 DIAGNOSIS — M5442 Lumbago with sciatica, left side: Secondary | ICD-10-CM

## 2016-12-13 DIAGNOSIS — B353 Tinea pedis: Secondary | ICD-10-CM | POA: Insufficient documentation

## 2016-12-13 DIAGNOSIS — R7303 Prediabetes: Secondary | ICD-10-CM | POA: Insufficient documentation

## 2016-12-13 DIAGNOSIS — N924 Excessive bleeding in the premenopausal period: Secondary | ICD-10-CM | POA: Insufficient documentation

## 2016-12-13 DIAGNOSIS — G8929 Other chronic pain: Secondary | ICD-10-CM | POA: Insufficient documentation

## 2016-12-13 LAB — CMP14+EGFR
ALT: 12 IU/L (ref 0–32)
AST: 16 IU/L (ref 0–40)
Albumin/Globulin Ratio: 1.3 (ref 1.2–2.2)
Albumin: 4.2 g/dL (ref 3.5–5.5)
Alkaline Phosphatase: 66 IU/L (ref 39–117)
BILIRUBIN TOTAL: 0.5 mg/dL (ref 0.0–1.2)
BUN/Creatinine Ratio: 17 (ref 9–23)
BUN: 12 mg/dL (ref 6–24)
CALCIUM: 8.8 mg/dL (ref 8.7–10.2)
CHLORIDE: 104 mmol/L (ref 96–106)
CO2: 23 mmol/L (ref 20–29)
Creatinine, Ser: 0.72 mg/dL (ref 0.57–1.00)
GFR, EST AFRICAN AMERICAN: 112 mL/min/{1.73_m2} (ref >59)
GFR, EST NON AFRICAN AMERICAN: 97 mL/min/{1.73_m2} (ref >59)
GLOBULIN, TOTAL: 3.3 g/dL (ref 1.5–4.5)
Glucose: 81 mg/dL (ref 65–99)
Potassium: 4.8 mmol/L (ref 3.5–5.2)
SODIUM: 141 mmol/L (ref 134–144)
TOTAL PROTEIN: 7.5 g/dL (ref 6.0–8.5)

## 2016-12-13 LAB — CBC
HEMATOCRIT: 42 % (ref 34.0–46.6)
Hemoglobin: 13.9 g/dL (ref 11.1–15.9)
MCH: 27.4 pg (ref 26.6–33.0)
MCHC: 33.1 g/dL (ref 31.5–35.7)
MCV: 83 fL (ref 79–97)
PLATELETS: 281 10*3/uL (ref 150–379)
RBC: 5.07 x10E6/uL (ref 3.77–5.28)
RDW: 15.1 % (ref 12.3–15.4)
WBC: 7.1 10*3/uL (ref 3.4–10.8)

## 2016-12-13 LAB — LIPID PANEL
CHOLESTEROL TOTAL: 163 mg/dL (ref 100–199)
Chol/HDL Ratio: 3.6 ratio (ref 0.0–4.4)
HDL: 45 mg/dL (ref >39)
LDL Calculated: 106 mg/dL — ABNORMAL HIGH (ref 0–99)
TRIGLYCERIDES: 58 mg/dL (ref 0–149)
VLDL CHOLESTEROL CAL: 12 mg/dL (ref 5–40)

## 2016-12-13 LAB — HEMOGLOBIN A1C
Est. average glucose Bld gHb Est-mCnc: 117 mg/dL
Hgb A1c MFr Bld: 5.7 % — ABNORMAL HIGH (ref 4.8–5.6)

## 2016-12-13 MED ORDER — MICONAZOLE NITRATE 2 % EX POWD: CUTANEOUS | 5 refills | Status: DC | PRN

## 2016-12-13 NOTE — Assessment & Plan Note (Signed)
A: HTN  Med: none P: Start HCTZ 12.5 mg daily

## 2016-12-13 NOTE — Assessment & Plan Note (Signed)
A1c 5.7 Advised reduced carbs (starches and sweets) and reduced fried foods as much as possible

## 2016-12-13 NOTE — Assessment & Plan Note (Addendum)
Chronic left sided back pain with radicular symptoms Prednisone taper Naproxen prn after taper We discussed gabapentin and muscle relaxer for additional pain relief but agreed that due to her homelessness it would be bet to avoid sedatives

## 2016-12-13 NOTE — Assessment & Plan Note (Signed)
A: excessive bleeding. Premenopausal. No active bleeding currently.  P: Naproxen day before and first two days of periods to reduce prostaglandins Provera if symptoms do not improve

## 2016-12-15 ENCOUNTER — Telehealth: Payer: Self-pay

## 2016-12-15 NOTE — Telephone Encounter (Signed)
Pt was called and number is invalid.

## 2016-12-26 ENCOUNTER — Telehealth: Payer: Self-pay | Admitting: Licensed Clinical Social Worker

## 2016-12-26 NOTE — Telephone Encounter (Signed)
[facility] received a message from pt requesting information on community resources.  [facility] attempted to contact pt via telephone and left a message for a return call.

## 2016-12-26 NOTE — Telephone Encounter (Signed)
[facility] received an incoming call from patient. Patient reported that she is in need of dental resources. [facility] informed patient of upcoming dental clinics. Information will be printed and available for patient to pick-up at the front desk.

## 2016-12-27 MED FILL — ?HYDROCHLOROTHIAZIDE 12.5MG: 12.5 | 30 days supply | Qty: 30 | Fill #0

## 2016-12-27 MED FILL — ?PREDNISONE 10 MG TABLET: 10 | 11 days supply | Qty: 21 | Fill #0

## 2017-01-09 ENCOUNTER — Ambulatory Visit: Payer: Self-pay | Admitting: Internal Medicine

## 2017-04-29 ENCOUNTER — Encounter (HOSPITAL_COMMUNITY): Payer: Self-pay | Admitting: Emergency Medicine

## 2017-04-29 ENCOUNTER — Emergency Department (HOSPITAL_COMMUNITY)
Admission: EM | Admit: 2017-04-29 | Discharge: 2017-04-29 | Disposition: A | Discharge status: Home / Self Care | Payer: Self-pay | Attending: Emergency Medicine | Admitting: Emergency Medicine

## 2017-04-29 DIAGNOSIS — I10 Essential (primary) hypertension: Secondary | ICD-10-CM | POA: Insufficient documentation

## 2017-04-29 DIAGNOSIS — M545 Low back pain, unspecified: Secondary | ICD-10-CM

## 2017-04-29 DIAGNOSIS — Z79899 Other long term (current) drug therapy: Secondary | ICD-10-CM | POA: Insufficient documentation

## 2017-04-29 MED ORDER — METHOCARBAMOL 500 MG PO TABS: 500.0000 mg | ORAL_TABLET | Freq: Two times a day (BID) | ORAL | 0 refills | Status: DC

## 2017-04-29 MED ORDER — LIDOCAINE 5 % EX PTCH: 1.0000 | MEDICATED_PATCH | CUTANEOUS | 0 refills | Status: DC

## 2017-04-29 MED ORDER — NAPROXEN 500 MG PO TABS: 500.0000 mg | ORAL_TABLET | Freq: Two times a day (BID) | ORAL | 0 refills | Status: DC

## 2017-04-29 NOTE — ED Triage Notes (Signed)
Patient c/o intermittent lower mid back pain for "long time" patient reports she did a "lot of wakikng yesterday and irritated it". Patient reports that pain is worse with movement, bending over and walking. Patient denies any falls or injuries. Patient unsure if she has any urinary problems.
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
## 2017-04-29 NOTE — ED Provider Notes (Signed)
[name] DEPT Provider Note   CSN: [id number] Arrival date & time: 04/29/17  1801     History   Chief Complaint Chief Complaint  Patient presents with  . Spasms  . Back Pain    HPI Emma Shepherd is a 51 y.o. female.  HPI   Emma Shepherd is a 51 y.o. female, with a history of HTN, obesity, and sciatica, presenting to the ED with lower back pain beginning yesterday.  States she has had similar pain in the past.  States, "I walked a lot yesterday, more than usual, and I think that is what is causing the pain."  States she woke up with stiffness in the back this morning.  Has not taken any medications or tried any therapies for her discomfort.  Pain is 6/10, described as a tightness and spasms, nonradiating.  Endorses similar pain in the past.  Denies falls/trauma, numbness, weakness, changes in bowel or bladder function, saddle anesthesias, or any other complaints.      Past Medical History:  Diagnosis Date  . Anxiety   . Hypertension 05/2013  . Obesity     Patient Active Problem List   Diagnosis Date Noted  . Chronic left-sided low back pain with left-sided sciatica 12/13/2016  . Excessive bleeding in premenopausal period 12/13/2016  . Tinea pedis of both feet 12/13/2016  . Prediabetes 12/13/2016  . Pelvic pain 02/15/2016  . Severe obesity (BMI >= 40) ([place]) 02/15/2016  . Bipolar affective disorder, currently manic, severe, with psychotic features ([place]) 10/15/2014  . HTN (hypertension) 08/06/2014    History reviewed. No pertinent surgical history.  OB History    No data available       Home Medications    Prior to Admission medications   Medication Sig Start Date End Date Taking? Authorizing Provider  hydrochlorothiazide (HYDRODIURIL) 12.5 MG tablet Take 1 tablet (12.5 mg total) by mouth daily. 12/12/16   Funches, Adriana Mccallum, MD  lidocaine (LIDODERM) 5 % Place 1 patch onto the skin daily. Remove & Discard patch within 12 hours  or as directed by MD 04/29/17   Joy, Shawn C, PA-C  methocarbamol (ROBAXIN) 500 MG tablet Take 1 tablet (500 mg total) by mouth 2 (two) times daily. 04/29/17   Joy, Shawn C, PA-C  miconazole (MICOTIN) 2 % powder Apply topically as needed for itching. 12/13/16   Funches, Adriana Mccallum, MD  naproxen (NAPROSYN) 500 MG tablet Take 1 tablet (500 mg total) by mouth 2 (two) times daily. Start the day before your period for first 2 days of period 12/12/16   Boykin Nearing, MD  naproxen (NAPROSYN) 500 MG tablet Take 1 tablet (500 mg total) by mouth 2 (two) times daily. 04/29/17   Joy, Shawn C, PA-C  predniSONE (DELTASONE) 10 MG tablet Take by mouth 4 tablets for 2 days, 3 tablets for 2 days, 2 tablets for 2 days, 1 tablet for 3 days then STOP 12/12/16   Boykin Nearing, MD    Family History No family history on file.  Social History Social History   Tobacco Use  . Smoking status: Never Smoker  . Smokeless tobacco: Never Used  Substance Use Topics  . Alcohol use: Yes    Comment: social  . Drug use: No     Allergies   Patient has no known allergies.   Review of Systems Review of Systems  Constitutional: Negative for chills and fever.  Gastrointestinal: Negative for abdominal pain, nausea and vomiting.  Musculoskeletal: Positive for back pain.  Neurological:  Negative for weakness and numbness.  All other systems reviewed and are negative.    Physical Exam Updated Vital Signs BP (!) 143/106 (BP Location: Left Arm)   Pulse (!) 102   Temp 98.3 F (36.8 C) (Oral)   Resp 19   LMP 04/26/2017   SpO2 98%   Physical Exam  Constitutional: She appears well-developed and well-nourished. No distress.  HENT:  Shepherd: Normocephalic and atraumatic.  Eyes: Conjunctivae are normal.  Neck: Normal range of motion. Neck supple.  Cardiovascular: Normal rate, regular rhythm and intact distal pulses.  Pulmonary/Chest: Effort normal. No respiratory distress.  Abdominal: There is no guarding.    Musculoskeletal: She exhibits no edema.  Tenderness to the musculature flanking the lumbar spine bilaterally. Normal motor function intact in all extremities and spine. No midline spinal tenderness.   Neurological: She is alert.  No noted acute sensory deficits. Strength 5/5 with flexion and extension at the bilateral hips, knees, and ankles. No noted gait deficit. Coordination intact with heel to shin testing.  Skin: Skin is warm and dry. She is not diaphoretic.  Psychiatric: She has a normal mood and affect. Her behavior is normal.  Nursing note and vitals reviewed.    ED Treatments / Results  Labs (all labs ordered are listed, but only abnormal results are displayed) Labs Reviewed - No data to display  EKG  EKG Interpretation None       Radiology No results found.  Procedures Procedures (including critical care time)  Medications Ordered in ED Medications - No data to display   Initial Impression / Assessment and Plan / ED Course  I have reviewed the triage vital signs and the nursing notes.  Pertinent labs & imaging results that were available during my care of the patient were reviewed by me and considered in my medical decision making (see chart for details).     Patient presents with acute on chronic lower back pain.  No neuro or functional deficits noted.  PCP follow-up. The patient was given instructions for home care as well as return precautions. Patient voices understanding of these instructions, accepts the plan, and is comfortable with discharge.   Final Clinical Impressions(s) / ED Diagnoses   Final diagnoses:  Acute bilateral low back pain without sciatica    ED Discharge Orders        Ordered    naproxen (NAPROSYN) 500 MG tablet  2 times daily     04/29/17 2001    methocarbamol (ROBAXIN) 500 MG tablet  2 times daily     04/29/17 2001    lidocaine (LIDODERM) 5 %  Every 24 hours     04/29/17 2001       Layla Maw 04/29/17  2232    Fatima Blank, MD 04/30/17 [phone]

## 2017-04-29 NOTE — Discharge Instructions (Signed)
Expect your soreness to increase over the next 2-3 days. Take it easy, but do not lay around too much as this may make any stiffness worse.  °Antiinflammatory medications: Take 600 mg of ibuprofen every 6 hours or 440 mg (over the counter dose) to 500 mg (prescription dose) of naproxen every 12 hours for the next 3 days. After this time, these medications may be used as needed for pain. Take these medications with food to avoid upset stomach. Choose only one of these medications, do not take them together.  °Tylenol: Should you continue to have additional pain while taking the ibuprofen or naproxen, you may add in tylenol as needed. Your daily total maximum amount of tylenol from all sources should be limited to 4000mg/day for persons without liver problems, or 2000mg/day for those with liver problems. °Muscle relaxer: Robaxin is a muscle relaxer and may help loosen stiff muscles. Do not take the Robaxin while driving or performing other dangerous activities.  °Lidocaine patches: These are available via either prescription or over-the-counter. The over-the-counter option may be more economical one and are likely just as effective. There are multiple over-the-counter brands, such as Salonpas. °Exercises: Be sure to perform the attached exercises starting with three times a week and working up to performing them daily. This is an essential part of preventing long term problems.  ° °Follow up with a primary care provider for any future management of these complaints. °

## 2017-04-29 NOTE — ED Notes (Signed)
ED Provider at bedside. 

## 2019-07-07 ENCOUNTER — Ambulatory Visit: Payer: Self-pay | Admitting: Family Medicine

## 2019-07-14 ENCOUNTER — Ambulatory Visit: Payer: Self-pay | Admitting: Internal Medicine

## 2019-07-18 ENCOUNTER — Ambulatory Visit: Payer: Self-pay | Admitting: Family Medicine

## 2019-08-06 ENCOUNTER — Ambulatory Visit: Payer: Self-pay | Admitting: Family Medicine

## 2019-10-28 ENCOUNTER — Telehealth: Payer: Self-pay | Admitting: Family Medicine

## 2019-10-28 NOTE — Telephone Encounter (Signed)
Patient came in requesting to speak with the financial counselor. Patient was told that she has not seen since 2018 and that she needed to speak with the financial counselor before scheduling an appointment. Patient has some questions before she schedules an appointment. Please f/u

## 2019-10-28 NOTE — Telephone Encounter (Signed)
Pt was explain that she need to be establish care with the provide before she can schedule and appt with financial

## 2019-12-20 ENCOUNTER — Encounter (HOSPITAL_COMMUNITY): Payer: Self-pay

## 2019-12-20 ENCOUNTER — Other Ambulatory Visit: Payer: Self-pay

## 2019-12-20 ENCOUNTER — Emergency Department (HOSPITAL_COMMUNITY)
Admission: EM | Admit: 2019-12-20 | Discharge: 2019-12-21 | Disposition: A | Discharge status: Home / Self Care | Payer: Self-pay | Attending: Emergency Medicine | Admitting: Emergency Medicine

## 2019-12-20 DIAGNOSIS — F419 Anxiety disorder, unspecified: Secondary | ICD-10-CM | POA: Insufficient documentation

## 2019-12-20 DIAGNOSIS — Z008 Encounter for other general examination: Secondary | ICD-10-CM | POA: Insufficient documentation

## 2019-12-20 DIAGNOSIS — I10 Essential (primary) hypertension: Secondary | ICD-10-CM | POA: Insufficient documentation

## 2019-12-20 LAB — CBC
HCT: 44 % (ref 36.0–46.0)
Hemoglobin: 13.7 g/dL (ref 12.0–15.0)
MCH: 26.1 pg (ref 26.0–34.0)
MCHC: 31.1 g/dL (ref 30.0–36.0)
MCV: 84 fL (ref 80.0–100.0)
Platelets: 264 10*3/uL (ref 150–400)
RBC: 5.24 MIL/uL — ABNORMAL HIGH (ref 3.87–5.11)
RDW: 14.7 % (ref 11.5–15.5)
WBC: 7.4 10*3/uL (ref 4.0–10.5)
nRBC: 0 % (ref 0.0–0.2)

## 2019-12-20 LAB — RAPID URINE DRUG SCREEN, HOSP PERFORMED
Amphetamines: NOT DETECTED
Barbiturates: NOT DETECTED
Benzodiazepines: NOT DETECTED
Cocaine: NOT DETECTED
Opiates: NOT DETECTED
Tetrahydrocannabinol: NOT DETECTED

## 2019-12-20 LAB — I-STAT BETA HCG BLOOD, ED (MC, WL, AP ONLY): I-stat hCG, quantitative: <5 m[IU]/mL (ref <5)

## 2019-12-20 LAB — COMPREHENSIVE METABOLIC PANEL
ALT: 17 U/L (ref 0–44)
AST: 15 U/L (ref 15–41)
Albumin: 3.5 g/dL (ref 3.5–5.0)
Alkaline Phosphatase: 59 U/L (ref 38–126)
Anion gap: 8 (ref 5–15)
BUN: 9 mg/dL (ref 6–20)
CO2: 27 mmol/L (ref 22–32)
Calcium: 9.2 mg/dL (ref 8.9–10.3)
Chloride: 101 mmol/L (ref 98–111)
Creatinine, Ser: 0.81 mg/dL (ref 0.44–1.00)
GFR calc Af Amer: >60 mL/min (ref >60)
GFR calc non Af Amer: >60 mL/min (ref >60)
Glucose, Bld: 105 mg/dL — ABNORMAL HIGH (ref 70–99)
Potassium: 3.7 mmol/L (ref 3.5–5.1)
Sodium: 136 mmol/L (ref 135–145)
Total Bilirubin: 0.7 mg/dL (ref 0.3–1.2)
Total Protein: 7.8 g/dL (ref 6.5–8.1)

## 2019-12-20 LAB — ETHANOL: Alcohol, Ethyl (B): <10 mg/dL (ref <10)

## 2019-12-20 LAB — SALICYLATE LEVEL: Salicylate Lvl: <7 mg/dL — ABNORMAL LOW (ref 7.0–30.0)

## 2019-12-20 LAB — ACETAMINOPHEN LEVEL: Acetaminophen (Tylenol), Serum: <10 ug/mL — ABNORMAL LOW (ref 10–30)

## 2019-12-20 MED ORDER — ALUM & MAG HYDROXIDE-SIMETH 200-200-20 MG/5ML PO SUSP
30.0000 mL | Freq: Once | ORAL | Status: AC
  Administered 2019-12-20: 30 mL via ORAL
  Filled 2019-12-20: qty 30

## 2019-12-20 NOTE — ED Triage Notes (Signed)
Pt BIB [facility] with IVC papers in place. Pt lives with her sister, currently taking her meds. IVC papers suggest pt is hearing voices, talking to "imaginary" people in her room, exposing her "private parts", hostile and aggressive toward sister. [facility] reports pt has been calm and cooperative with them

## 2019-12-20 NOTE — ED Provider Notes (Signed)
[facility] Emergency Department Provider Note MRN:  [id number]  [place] date & time: 12/20/19     Chief Complaint   Medical Clearance   History of Present Illness   Emma Shepherd is a 54 y.o. year-old female with a history of bipolar disorder presenting to the ED with chief complaint of medical clearance.  Patient here under IVC filled out by sister, concern for erratic behavior, report of hearing voices and talking to imaginary people, report of exposing her private parts to people, report of hostile and aggressive behavior.  Patient denies any of this, claiming that her sister is doing this to control her.  Review of Systems  A complete 10 system review of systems was obtained and all systems are negative except as noted in the HPI and PMH.   Patient's Health History    Past Medical History:  Diagnosis Date  . Anxiety   . Hypertension 05/2013  . Obesity     History reviewed. No pertinent surgical history.  History reviewed. No pertinent family history.  Social History   Socioeconomic History  . Marital status: Single    Spouse name: Not on file  . Number of children: Not on file  . Years of education: Not on file  . Highest education level: Not on file  Occupational History  . Not on file  Tobacco Use  . Smoking status: Never Smoker  . Smokeless tobacco: Never Used  Vaping Use  . Vaping Use: Never used  Substance and Sexual Activity  . Alcohol use: Yes    Comment: social  . Drug use: No  . Sexual activity: Yes  Other Topics Concern  . Not on file  Social History Narrative  . Not on file   Social Determinants of Health   Financial Resource Strain:   . Difficulty of Paying Living Expenses:   Food Insecurity:   . Worried About Charity fundraiser in the Last Year:   . Arboriculturist in the Last Year:   Transportation Needs:   . Film/video editor (Medical):   Marland Kitchen Lack of Transportation (Non-Medical):   Physical Activity:     . Days of Exercise per Week:   . Minutes of Exercise per Session:   Stress:   . Feeling of Stress :   Social Connections:   . Frequency of Communication with Friends and Family:   . Frequency of Social Gatherings with Friends and Family:   . Attends Religious Services:   . Active Member of Clubs or Organizations:   . Attends Archivist Meetings:   Marland Kitchen Marital Status:   Intimate Partner Violence:   . Fear of Current or Ex-Partner:   . Emotionally Abused:   Marland Kitchen Physically Abused:   . Sexually Abused:      Physical Exam   Vitals:   12/20/19 1234 12/20/19 1700  BP: (!) 163/116   Pulse: 94   Resp: 18 18  Temp: 98.9 F (37.2 C) 98 F (36.7 C)  SpO2: 100%     CONSTITUTIONAL: Well-appearing, NAD NEURO:  Alert and oriented x 3, no focal deficits EYES:  eyes equal and reactive ENT/NECK:  no LAD, no JVD CARDIO: Regular rate, well-perfused, normal S1 and S2 PULM:  CTAB no wheezing or rhonchi GI/GU:  normal bowel sounds, non-distended, non-tender MSK/SPINE:  No gross deformities, no edema SKIN:  no rash, atraumatic PSYCH:  Appropriate speech and behavior  *Additional and/or pertinent findings included in MDM below  Diagnostic and  Interventional Summary    EKG Interpretation  Date/Time:    Ventricular Rate:    PR Interval:    QRS Duration:   QT Interval:    QTC Calculation:   R Axis:     Text Interpretation:        Labs Reviewed  COMPREHENSIVE METABOLIC PANEL - Abnormal; Notable for the following components:      Result Value   Glucose, Bld 105 (*)    All other components within normal limits  SALICYLATE LEVEL - Abnormal; Notable for the following components:   Salicylate Lvl <4.5 (*)    All other components within normal limits  ACETAMINOPHEN LEVEL - Abnormal; Notable for the following components:   Acetaminophen (Tylenol), Serum <10 (*)    All other components within normal limits  CBC - Abnormal; Notable for the following components:   RBC 5.24 (*)     All other components within normal limits  ETHANOL  RAPID URINE DRUG SCREEN, HOSP PERFORMED  I-STAT BETA HCG BLOOD, ED ([facility], [facility], [facility] ONLY)    No orders to display    Medications - No data to display   Procedures  /  Critical Care Procedures  ED Course and Medical Decision Making  I have reviewed the triage vital signs, the nursing notes, and pertinent available records from the EMR.  Listed above are laboratory and imaging tests that I personally ordered, reviewed, and interpreted and then considered in my medical decision making (see below for details).      Patient seems lucid and appropriate on exam, denies SI or HI or AVH.  Unclear if she is being fully truthful or if she is simply good at covering up her current issues, given her IVC paperwork, will consult behavioral health for assistance with disposition.  She is medically cleared.  Signed out to default provider at shift change.    Barth Kirks. Sedonia Small, [place] [email or web address]  Final Clinical Impressions(s) / ED Diagnoses     ICD-10-CM   1. Encounter for psychological evaluation  Z00.8     ED Discharge Orders    None       Discharge Instructions Discussed with and Provided to Patient:   Discharge Instructions   None       Maudie Flakes, MD 12/20/19 2050

## 2019-12-20 NOTE — ED Notes (Signed)
Security at bedside on standby, as the patient was up by the room door and wanting to leave. Patient  is complaining of the wait time and when she is being evaluated. Attempted to explain the process to patient and patient continued to c/o wait time and how her treatment is at this facility is unsatisfactory. Patient then stated "I don't want to listen to you anymore, I can't understand you with your hispanic accent". Security then assisted re-directing patient, and able to obtain personal belongings: yellow purse which patient refused to be itemized and stated "would it be easier to just keep it together, since I will be leaving soon" Patient's belongings is placed in locker room 6, 2 bags, 1 for the said purse and another for clothing and footwear.
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
## 2019-12-20 NOTE — BHH Counselor (Addendum)
Disposition:   Per Waylan Boga, NP, patient recommended for overnight observation, reassessment in the morning.

## 2019-12-20 NOTE — ED Notes (Signed)
Attempted to obtain patient's vitals and assessment as well as to encourage patient to change into assigned scrubs for [facility] patients, at this time patient refused. Stating "I understand I am under involuntary commitment, but this is all a big misunderstanding and I'm here to see the doctor so they can let me go". Patient appears pleasant and tone is WNL, patient remain seated in bed.
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
## 2019-12-21 NOTE — ED Notes (Signed)
Pt d/c home per MD order. Discharge summary reviewed with pt - personal property returned to patient- case management to provide pt with lyft transport. Pt ambulatory off unit. No s/s of acute distress noted.

## 2019-12-21 NOTE — ED Notes (Signed)
First examination and IVC rescind  completed by Farris Has NP- forms given to nursing secretary to fax.

## 2019-12-21 NOTE — ED Notes (Signed)
Lunch ordered 

## 2019-12-21 NOTE — ED Notes (Signed)
Pt provided shower supplies, taking a shower

## 2019-12-21 NOTE — ED Provider Notes (Signed)
Emergency Medicine Observation Re-evaluation Note  Emma Shepherd is a 54 y.o. female, seen on rounds today.  Pt initially presented to the ED for complaints of Medical Clearance Currently, the patient is walking to the bathroom, calm and cooperative.  Physical Exam  BP 139/84 (BP Location: Right Wrist)   Pulse 66   Temp 97.9 F (36.6 C) (Oral)   Resp 17   SpO2 96%  Physical Exam Vitals and nursing note reviewed.  Constitutional:      General: She is not in acute distress.    Appearance: She is well-developed.  HENT:     Head: Normocephalic and atraumatic.  Eyes:     General:        Right eye: No discharge.        Left eye: No discharge.     Conjunctiva/sclera: Conjunctivae normal.  Neck:     Vascular: No JVD.     Trachea: No tracheal deviation.  Cardiovascular:     Rate and Rhythm: Normal rate.  Pulmonary:     Effort: Pulmonary effort is normal.  Abdominal:     General: There is no distension.  Skin:    General: Skin is dry.     Findings: No erythema.  Neurological:     Mental Status: She is alert.  Psychiatric:        Behavior: Behavior normal.     ED Course / MDM  EKG:    I have reviewed the labs performed to date as well as medications administered while in observation.  Recent changes in the last 24 hours include overnight observation.  Plan  Current plan is for a.m. psychiatric evaluation. Patient is under full IVC at this time.   Renita Papa, PA-C 12/21/19 1205    Margette Fast, MD 12/22/19 [phone]

## 2019-12-21 NOTE — ED Notes (Signed)
[facility] NP at bedside

## 2019-12-24 ENCOUNTER — Ambulatory Visit: Payer: Self-pay | Admitting: Family Medicine

## 2020-01-04 ENCOUNTER — Encounter (HOSPITAL_COMMUNITY): Payer: Self-pay

## 2020-01-04 ENCOUNTER — Emergency Department (HOSPITAL_COMMUNITY)
Admission: EM | Admit: 2020-01-04 | Discharge: 2020-01-05 | Disposition: A | Discharge status: Home / Self Care | Payer: Self-pay | Attending: Emergency Medicine | Admitting: Emergency Medicine

## 2020-01-04 ENCOUNTER — Other Ambulatory Visit: Payer: Self-pay

## 2020-01-04 DIAGNOSIS — I10 Essential (primary) hypertension: Secondary | ICD-10-CM | POA: Insufficient documentation

## 2020-01-04 DIAGNOSIS — R519 Headache, unspecified: Secondary | ICD-10-CM | POA: Insufficient documentation

## 2020-01-04 LAB — I-STAT BETA HCG BLOOD, ED (MC, WL, AP ONLY): I-stat hCG, quantitative: <5 m[IU]/mL (ref <5)

## 2020-01-04 NOTE — ED Triage Notes (Signed)
Onset last night headache around forehead and around eyes.  No h/o migraines.  Denies nausea, light sensitivity, seeing spots., cough or cold symptoms.

## 2020-01-05 ENCOUNTER — Emergency Department (HOSPITAL_COMMUNITY): Payer: Self-pay

## 2020-01-05 LAB — BASIC METABOLIC PANEL
Anion gap: 6 (ref 5–15)
BUN: 9 mg/dL (ref 6–20)
CO2: 29 mmol/L (ref 22–32)
Calcium: 9 mg/dL (ref 8.9–10.3)
Chloride: 106 mmol/L (ref 98–111)
Creatinine, Ser: 0.83 mg/dL (ref 0.44–1.00)
GFR calc Af Amer: >60 mL/min (ref >60)
GFR calc non Af Amer: >60 mL/min (ref >60)
Glucose, Bld: 103 mg/dL — ABNORMAL HIGH (ref 70–99)
Potassium: 3.8 mmol/L (ref 3.5–5.1)
Sodium: 141 mmol/L (ref 135–145)

## 2020-01-05 LAB — CBC
HCT: 42.6 % (ref 36.0–46.0)
Hemoglobin: 12.8 g/dL (ref 12.0–15.0)
MCH: 25.7 pg — ABNORMAL LOW (ref 26.0–34.0)
MCHC: 30 g/dL (ref 30.0–36.0)
MCV: 85.5 fL (ref 80.0–100.0)
Platelets: 279 10*3/uL (ref 150–400)
RBC: 4.98 MIL/uL (ref 3.87–5.11)
RDW: 15.2 % (ref 11.5–15.5)
WBC: 8.6 10*3/uL (ref 4.0–10.5)
nRBC: 0 % (ref 0.0–0.2)

## 2020-01-05 NOTE — ED Notes (Signed)
Reviewed discharge instructions with patient. Follow-up care reviewed. Patient verbalized understanding. Patient A&Ox4, VSS, and ambulatory with steady gait upon discharge.  

## 2020-01-05 NOTE — ED Provider Notes (Signed)
[facility] [place] EMERGENCY DEPARTMENT Provider Note   CSN: [id number] Arrival date & time: 01/04/20  2215     History Chief Complaint  Patient presents with  . Headache    Emma Shepherd is a 54 y.o. female.  HPI      Emma Shepherd is a 54 y.o. female, with a history of anxiety, HTN, obesity, presenting to the ED with headache starting yesterday. States her pain felt like a throbbing or pressure along the frontal aspect of her head, bilateral, constant, moderate to severe. During her time in the waiting room, her headache improved to resolution.  Denies recent illness, fever, neck pain/stiffness, chest pain, shortness of breath, cough, acute numbness, acute weakness, confusion, falls/trauma, dizziness, N/V/D, or any other complaints.   Past Medical History:  Diagnosis Date  . Anxiety   . Hypertension 05/2013  . Obesity     Patient Active Problem List   Diagnosis Date Noted  . Chronic left-sided low back pain with left-sided sciatica 12/13/2016  . Excessive bleeding in premenopausal period 12/13/2016  . Tinea pedis of both feet 12/13/2016  . Prediabetes 12/13/2016  . Pelvic pain 02/15/2016  . Severe obesity (BMI >= 40) ([place]) 02/15/2016  . Bipolar affective disorder, currently manic, severe, with psychotic features ([place]) 10/15/2014  . HTN (hypertension) 08/06/2014    History reviewed. No pertinent surgical history.   OB History   No obstetric history on file.     History reviewed. No pertinent family history.  Social History   Tobacco Use  . Smoking status: Never Smoker  . Smokeless tobacco: Never Used  Vaping Use  . Vaping Use: Never used  Substance Use Topics  . Alcohol use: Yes    Comment: social  . Drug use: No    Home Medications Prior to Admission medications   Medication Sig Start Date End Date Taking? Authorizing Provider  hydrochlorothiazide (HYDRODIURIL) 12.5 MG tablet Take 1 tablet (12.5 mg total) by mouth daily. Patient  not taking: Reported on 12/20/2019 12/12/16   Boykin Nearing, MD  lidocaine (LIDODERM) 5 % Place 1 patch onto the skin daily. Remove & Discard patch within 12 hours or as directed by MD Patient not taking: Reported on 12/20/2019 04/29/17   Izabel Chim, Helane Gunther, PA-C  methocarbamol (ROBAXIN) 500 MG tablet Take 1 tablet (500 mg total) by mouth 2 (two) times daily. Patient not taking: Reported on 12/20/2019 04/29/17   Arlean Hopping C, PA-C  miconazole (MICOTIN) 2 % powder Apply topically as needed for itching. Patient not taking: Reported on 12/20/2019 12/13/16   Boykin Nearing, MD  naproxen (NAPROSYN) 500 MG tablet Take 1 tablet (500 mg total) by mouth 2 (two) times daily. Start the day before your period for first 2 days of period Patient not taking: Reported on 12/20/2019 12/12/16   Boykin Nearing, MD  naproxen (NAPROSYN) 500 MG tablet Take 1 tablet (500 mg total) by mouth 2 (two) times daily. Patient not taking: Reported on 12/20/2019 04/29/17   Lorayne Bender, PA-C    Allergies    Patient has no known allergies.  Review of Systems   Review of Systems  Constitutional: Negative for chills, diaphoresis and fever.  Eyes: Negative for visual disturbance.  Respiratory: Negative for shortness of breath.   Cardiovascular: Negative for chest pain.  Gastrointestinal: Negative for abdominal pain, diarrhea, nausea and vomiting.  Musculoskeletal: Negative for neck pain and neck stiffness.  Neurological: Positive for headaches. Negative for dizziness, syncope, weakness and numbness.  All other systems reviewed  and are negative.   Physical Exam Updated Vital Signs BP (!) 137/97 (BP Location: Left Wrist)   Pulse 73   Temp 98 F (36.7 C) (Oral)   Resp 14   LMP 12/27/2019   SpO2 100%   Physical Exam Vitals and nursing note reviewed.  Constitutional:      General: She is not in acute distress.    Appearance: She is well-developed. She is obese. She is not diaphoretic.  HENT:     Head: Normocephalic and  atraumatic.     Mouth/Throat:     Mouth: Mucous membranes are moist.     Pharynx: Oropharynx is clear.  Eyes:     Conjunctiva/sclera: Conjunctivae normal.  Cardiovascular:     Rate and Rhythm: Normal rate and regular rhythm.     Pulses: Normal pulses.          Radial pulses are 2+ on the right side and 2+ on the left side.       Posterior tibial pulses are 2+ on the right side and 2+ on the left side.     Comments: Tactile temperature in the extremities appropriate and equal bilaterally. Pulmonary:     Effort: Pulmonary effort is normal. No respiratory distress.  Abdominal:     Tenderness: There is no guarding.  Musculoskeletal:     Cervical back: Normal range of motion and neck supple.     Right lower leg: No edema.     Left lower leg: No edema.  Lymphadenopathy:     Cervical: No cervical adenopathy.  Skin:    General: Skin is warm and dry.  Neurological:     Mental Status: She is alert and oriented to person, place, and time.     Comments: No noted acute cognitive deficit. Sensation grossly intact to light touch in the extremities.   Grip strengths equal bilaterally.   Strength 5/5 in all extremities.  No gait disturbance.  Coordination intact.  Cranial nerves III-XII grossly intact.  Handles oral secretions without noted difficulty.  No noted phonation or speech deficit. No facial droop.   Psychiatric:        Mood and Affect: Mood and affect normal.        Speech: Speech normal.        Behavior: Behavior normal.     ED Results / Procedures / Treatments   Labs (all labs ordered are listed, but only abnormal results are displayed) Labs Reviewed  CBC - Abnormal; Notable for the following components:      Result Value   MCH 25.7 (*)    All other components within normal limits  BASIC METABOLIC PANEL - Abnormal; Notable for the following components:   Glucose, Bld 103 (*)    All other components within normal limits  I-STAT BETA HCG BLOOD, ED ([facility], [facility], [facility] ONLY)     EKG None  Radiology CT Head Wo Contrast  Result Date: 01/05/2020 CLINICAL DATA:  Headache, hypertension EXAM: CT HEAD WITHOUT CONTRAST TECHNIQUE: Contiguous axial images were obtained from the base of the skull through the vertex without intravenous contrast. COMPARISON:  None. FINDINGS: Brain: A small right parietal convexity cystic hygroma or arachnoid cyst is suspected at the vertex. There is minimal mass effect upon the right parietal lobe. No abnormal intra or extra-axial mass lesion. No abnormal mass effect or midline shift. No evidence of acute intracranial hemorrhage or infarct. Ventricular size is normal. Cerebellum unremarkable. Vascular: Unremarkable Skull: Intact Sinuses/Orbits: Paranasal sinuses are clear. There is a  remote fracture of the right medial orbital wall. The orbits are otherwise unremarkable. Other: Mastoid air cells and middle ear cavities are clear. IMPRESSION: 1. No acute intracranial abnormality. 2. 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 Electronically Signed   By: Fidela Salisbury MD   On: 01/05/2020 01:55    Procedures Procedures (including critical care time)  Medications Ordered in ED Medications - No data to display  ED Course  I have reviewed the triage vital signs and the nursing notes.  Pertinent labs & imaging results that were available during my care of the patient were reviewed by me and considered in my medical decision making (see chart for details).    MDM Rules/Calculators/A&P                          Patient presents with headache beginning yesterday. Patient is nontoxic appearing, afebrile, not tachycardic, not tachypneic, not hypotensive, maintains excellent SPO2 on room air, and is in no apparent distress.  No focal neurologic deficits.  I reviewed and interpreted the patient's labs and radiological studies. Lab work reassuring.  CT with small right parietal cyst.  This finding was discussed with the patient and  a printout of the CT read was given directly to the patient. She was advised to discuss this with her PCP who will then direct any further assessment or management. The patient was given instructions for home care as well as return precautions. Patient voices understanding of these instructions, accepts the plan, and is comfortable with discharge.  Findings and plan of care discussed with Fredia Sorrow, MD.   Vitals:   01/05/20 0177 01/05/20 0905 01/05/20 1124 01/05/20 1316  BP: (!) 171/104 (!) 152/91 (!) 137/97 140/85  Pulse: 65 64 73 68  Resp: 16 15 14 17   Temp: 98.2 F (36.8 C) 98 F (36.7 C)  98.2 F (36.8 C)  TempSrc: Oral Oral  Oral  SpO2: 100% 100% 100% 94%     Final Clinical Impression(s) / ED Diagnoses Final diagnoses:  Frontal headache    Rx / DC Orders ED Discharge Orders    None       Layla Maw 01/05/20 1348    Fredia Sorrow, MD 01/06/20 1052

## 2020-01-05 NOTE — ED Notes (Signed)
Walked patient to the bathroom patient did well 

## 2020-01-05 NOTE — Discharge Instructions (Signed)
Headache Your lab results showed no acute abnormalities.  For future headaches please try the following regimen: Antiinflammatory medications: Take 600 mg of ibuprofen every 6 hours or 440 mg (over the counter dose) to 500 mg (prescription dose) of naproxen every 12 hours.  Use this regimen until headache subsides for up to 3 days .  After this time, these medications may be used as needed for pain. Take these medications with food to avoid upset stomach. Choose only one of these medications, do not take them together. Acetaminophen: Should you continue to have additional pain while taking the ibuprofen or naproxen, you may add in acetaminophen (generic for Tylenol) as needed. Your daily total maximum amount of acetaminophen from all sources should be limited to 4000mg /day for persons without liver problems, or 2000mg /day for those with liver problems.  Hydration: Have a goal of about a half liter of water every couple hours to stay well hydrated.   Sleep: Please be sure to get plenty of sleep with a goal of 8 hours per night. Having a regular bed time and bedtime routine can help with this.  Screens: Reduce the amount of time you are in front of screens.  Take about a 5-10-minute break every hour or every couple hours to give your eyes rest.  Do not use screens in dark rooms.  Glasses with a blue light filter may also help reduce eye fatigue.  Stress: Take steps to reduce stress as much as possible.   Follow up: Follow-up with your primary care provider on this issue.  May also need to follow-up with the neurologist if there is an increased frequency of headaches.  Please follow-up with your primary care provider regarding your high blood pressure.  On the CT scan of the head, there was a small cyst noted.  Any further evaluation should be directed through your primary care provider.

## 2020-01-08 ENCOUNTER — Emergency Department (HOSPITAL_COMMUNITY): Payer: Self-pay

## 2020-01-08 ENCOUNTER — Emergency Department (HOSPITAL_COMMUNITY)
Admission: EM | Admit: 2020-01-08 | Discharge: 2020-01-08 | Disposition: A | Discharge status: Home / Self Care | Payer: Self-pay | Attending: Emergency Medicine | Admitting: Emergency Medicine

## 2020-01-08 ENCOUNTER — Emergency Department (HOSPITAL_BASED_OUTPATIENT_CLINIC_OR_DEPARTMENT_OTHER): Payer: Self-pay

## 2020-01-08 ENCOUNTER — Encounter (HOSPITAL_COMMUNITY): Payer: Self-pay

## 2020-01-08 ENCOUNTER — Other Ambulatory Visit: Payer: Self-pay

## 2020-01-08 DIAGNOSIS — R609 Edema, unspecified: Secondary | ICD-10-CM

## 2020-01-08 DIAGNOSIS — M7989 Other specified soft tissue disorders: Secondary | ICD-10-CM

## 2020-01-08 DIAGNOSIS — Z79899 Other long term (current) drug therapy: Secondary | ICD-10-CM | POA: Insufficient documentation

## 2020-01-08 DIAGNOSIS — R600 Localized edema: Secondary | ICD-10-CM

## 2020-01-08 DIAGNOSIS — R2243 Localized swelling, mass and lump, lower limb, bilateral: Secondary | ICD-10-CM | POA: Insufficient documentation

## 2020-01-08 DIAGNOSIS — I10 Essential (primary) hypertension: Secondary | ICD-10-CM | POA: Insufficient documentation

## 2020-01-08 LAB — COMPREHENSIVE METABOLIC PANEL
ALT: 14 U/L (ref 0–44)
AST: 16 U/L (ref 15–41)
Albumin: 3.2 g/dL — ABNORMAL LOW (ref 3.5–5.0)
Alkaline Phosphatase: 61 U/L (ref 38–126)
Anion gap: 8 (ref 5–15)
BUN: 8 mg/dL (ref 6–20)
CO2: 27 mmol/L (ref 22–32)
Calcium: 8.6 mg/dL — ABNORMAL LOW (ref 8.9–10.3)
Chloride: 106 mmol/L (ref 98–111)
Creatinine, Ser: 0.75 mg/dL (ref 0.44–1.00)
GFR calc Af Amer: >60 mL/min (ref >60)
GFR calc non Af Amer: >60 mL/min (ref >60)
Glucose, Bld: 90 mg/dL (ref 70–99)
Potassium: 3.9 mmol/L (ref 3.5–5.1)
Sodium: 141 mmol/L (ref 135–145)
Total Bilirubin: 0.4 mg/dL (ref 0.3–1.2)
Total Protein: 6.9 g/dL (ref 6.5–8.1)

## 2020-01-08 LAB — CBC WITH DIFFERENTIAL/PLATELET
Abs Immature Granulocytes: 0.06 10*3/uL (ref 0.00–0.07)
Basophils Absolute: 0 10*3/uL (ref 0.0–0.1)
Basophils Relative: 0 %
Eosinophils Absolute: 0.2 10*3/uL (ref 0.0–0.5)
Eosinophils Relative: 2 %
HCT: 39.9 % (ref 36.0–46.0)
Hemoglobin: 12.1 g/dL (ref 12.0–15.0)
Immature Granulocytes: 1 %
Lymphocytes Relative: 24 %
Lymphs Abs: 2.2 10*3/uL (ref 0.7–4.0)
MCH: 25.9 pg — ABNORMAL LOW (ref 26.0–34.0)
MCHC: 30.3 g/dL (ref 30.0–36.0)
MCV: 85.4 fL (ref 80.0–100.0)
Monocytes Absolute: 0.5 10*3/uL (ref 0.1–1.0)
Monocytes Relative: 5 %
Neutro Abs: 6.4 10*3/uL (ref 1.7–7.7)
Neutrophils Relative %: 68 %
Platelets: 263 10*3/uL (ref 150–400)
RBC: 4.67 MIL/uL (ref 3.87–5.11)
RDW: 15.1 % (ref 11.5–15.5)
WBC: 9.5 10*3/uL (ref 4.0–10.5)
nRBC: 0 % (ref 0.0–0.2)

## 2020-01-08 MED ORDER — HYDROCHLOROTHIAZIDE 25 MG PO TABS
25.0000 mg | ORAL_TABLET | Freq: Every day | ORAL | Status: DC
  Administered 2020-01-08: 25 mg via ORAL
  Filled 2020-01-08: qty 1

## 2020-01-08 MED ORDER — HYDROCHLOROTHIAZIDE 25 MG PO TABS: 25.0000 mg | ORAL_TABLET | Freq: Every day | ORAL | 2 refills | Status: DC

## 2020-01-08 MED ORDER — HYDROCHLOROTHIAZIDE 25 MG PO TABS: 25.0000 mg | ORAL_TABLET | Freq: Every day | ORAL | 2 refills | Status: AC

## 2020-01-08 NOTE — ED Notes (Signed)
Patient verbalizes understanding of discharge instructions. Opportunity for questioning and answers were provided. Armband removed by staff, pt discharged from ED stable & ambulatory  

## 2020-01-08 NOTE — ED Triage Notes (Signed)
Pt presents with BLE swelling x2 weeks. Pt denies any hx of edema

## 2020-01-08 NOTE — ED Provider Notes (Signed)
[facility] EMERGENCY DEPARTMENT Provider Note   CSN: [id number] Arrival date & time: 01/08/20  0700     History Chief Complaint  Patient presents with  . Leg Swelling    Emma Shepherd is a 54 y.o. female.  Pt complains of bilat leg swelling.    The history is provided by the patient. No language interpreter was used.  Leg Pain Location:  Leg Leg location:  L leg and R leg Pain details:    Quality:  Aching   Radiates to:  Does not radiate   Severity:  Moderate   Onset quality:  Gradual   Timing:  Constant   Progression:  Worsening Chronicity:  New Foreign body present:  No foreign bodies Prior injury to area:  No Relieved by:  Nothing Worsened by:  Nothing Ineffective treatments:  None tried Associated symptoms: no back pain   Risk factors: no concern for non-accidental trauma   Pt complains of swelling in both legs.  Pt denies shortness of breath.  No fever no cough    Past Medical History:  Diagnosis Date  . Anxiety   . Hypertension 05/2013  . Obesity     Patient Active Problem List   Diagnosis Date Noted  . Chronic left-sided low back pain with left-sided sciatica 12/13/2016  . Excessive bleeding in premenopausal period 12/13/2016  . Tinea pedis of both feet 12/13/2016  . Prediabetes 12/13/2016  . Pelvic pain 02/15/2016  . Severe obesity (BMI >= 40) ([place]) 02/15/2016  . Bipolar affective disorder, currently manic, severe, with psychotic features ([place]) 10/15/2014  . HTN (hypertension) 08/06/2014    History reviewed. No pertinent surgical history.   OB History   No obstetric history on file.     History reviewed. No pertinent family history.  Social History   Tobacco Use  . Smoking status: Never Smoker  . Smokeless tobacco: Never Used  Vaping Use  . Vaping Use: Never used  Substance Use Topics  . Alcohol use: Yes    Comment: social  . Drug use: No    Home Medications Prior to Admission medications   Medication Sig  Start Date End Date Taking? Authorizing Provider  hydrochlorothiazide (HYDRODIURIL) 12.5 MG tablet Take 1 tablet (12.5 mg total) by mouth daily. Patient not taking: Reported on 12/20/2019 12/12/16   Boykin Nearing, MD  lidocaine (LIDODERM) 5 % Place 1 patch onto the skin daily. Remove & Discard patch within 12 hours or as directed by MD Patient not taking: Reported on 12/20/2019 04/29/17   Joy, Helane Gunther, PA-C  methocarbamol (ROBAXIN) 500 MG tablet Take 1 tablet (500 mg total) by mouth 2 (two) times daily. Patient not taking: Reported on 12/20/2019 04/29/17   Arlean Hopping C, PA-C  miconazole (MICOTIN) 2 % powder Apply topically as needed for itching. Patient not taking: Reported on 12/20/2019 12/13/16   Boykin Nearing, MD  naproxen (NAPROSYN) 500 MG tablet Take 1 tablet (500 mg total) by mouth 2 (two) times daily. Start the day before your period for first 2 days of period Patient not taking: Reported on 12/20/2019 12/12/16   Boykin Nearing, MD  naproxen (NAPROSYN) 500 MG tablet Take 1 tablet (500 mg total) by mouth 2 (two) times daily. Patient not taking: Reported on 12/20/2019 04/29/17   Lorayne Bender, PA-C    Allergies    Patient has no known allergies.  Review of Systems   Review of Systems  Musculoskeletal: Negative for back pain.  All other systems reviewed and are  negative.   Physical Exam Updated Vital Signs BP (!) 173/97 (BP Location: Right Wrist)   Pulse 69   Temp 98 F (36.7 C) (Oral)   Resp 18   Ht 5\' 4"  (1.626 m)   Wt 115.5 kg   LMP 12/27/2019   SpO2 100%   BMI 43.71 kg/m   Physical Exam Vitals and nursing note reviewed.  Constitutional:      Appearance: She is well-developed.  HENT:     Head: Normocephalic.  Cardiovascular:     Rate and Rhythm: Normal rate.     Pulses: Normal pulses.  Pulmonary:     Effort: Pulmonary effort is normal.  Abdominal:     General: Abdomen is flat. There is no distension.  Musculoskeletal:        General: Swelling and tenderness present.       Cervical back: Normal range of motion.     Comments: Swelling bilat lower legs. No pitting  nv and ns intact   Skin:    General: Skin is warm.  Neurological:     General: No focal deficit present.     Mental Status: She is alert and oriented to person, place, and time.  Psychiatric:        Mood and Affect: Mood normal.     ED Results / Procedures / Treatments   Labs (all labs ordered are listed, but only abnormal results are displayed) Labs Reviewed  COMPREHENSIVE METABOLIC PANEL - Abnormal; Notable for the following components:      Result Value   Calcium 8.6 (*)    Albumin 3.2 (*)    All other components within normal limits  CBC WITH DIFFERENTIAL/PLATELET - Abnormal; Notable for the following components:   MCH 25.9 (*)    All other components within normal limits    EKG EKG Interpretation  Date/Time:  Thursday January 08 2020 19:32:17 EDT Ventricular Rate:  61 PR Interval:  130 QRS Duration: 100 QT Interval:  424 QTC Calculation: 426 R Axis:   64 Text Interpretation: Normal sinus rhythm Incomplete right bundle branch block Nonspecific ST and T wave abnormality Abnormal ECG No old tracing to compare Confirmed by Calvert Cantor [phone]) on 01/08/2020 7:43:01 PM   Radiology DG Chest 1 View  Result Date: 01/08/2020 CLINICAL DATA:  Leg swelling EXAM: CHEST  1 VIEW COMPARISON:  None. FINDINGS: Mild cardiomegaly with central vascular congestion. Mild diffuse interstitial opacity. No pleural effusion or pneumothorax IMPRESSION: Cardiomegaly with central vascular congestion and mild diffuse interstitial opacity, possible mild edema. Electronically Signed   By: Donavan Foil M.D.   On: 01/08/2020 18:30   VAS [place] LOWER EXTREMITY VENOUS (DVT) (ONLY [facility] & [facility])  Result Date: 01/08/2020  Lower Venous DVTStudy Indications: Swelling.  Limitations: Poor ultrasound/tissue interface and body habitus. Comparison Study: No prior study Performing Technologist: Maudry Mayhew MHA, RDMS,  RVT, RDCS  Examination Guidelines: A complete evaluation includes B-mode imaging, spectral Doppler, color Doppler, and power Doppler as needed of all accessible portions of each vessel. Bilateral testing is considered an integral part of a complete examination. Limited examinations for reoccurring indications may be performed as noted. The reflux portion of the exam is performed with the patient in reverse Trendelenburg.  +---------+---------------+---------+-----------+----------+--------------+ RIGHT    CompressibilityPhasicitySpontaneityPropertiesThrombus Aging +---------+---------------+---------+-----------+----------+--------------+ CFV      Full           Yes      Yes                                 +---------+---------------+---------+-----------+----------+--------------+  SFJ      Full                                                        +---------+---------------+---------+-----------+----------+--------------+ FV Prox  Full                                                        +---------+---------------+---------+-----------+----------+--------------+ FV Mid   Full                                                        +---------+---------------+---------+-----------+----------+--------------+ FV DistalFull                                                        +---------+---------------+---------+-----------+----------+--------------+ PFV      Full                                                        +---------+---------------+---------+-----------+----------+--------------+ POP      Full           Yes      Yes                                 +---------+---------------+---------+-----------+----------+--------------+ PTV      Full                                                        +---------+---------------+---------+-----------+----------+--------------+ PERO     Full                                                         +---------+---------------+---------+-----------+----------+--------------+   Right Technical Findings: Not visualized segments include limited evaluation of PTV, peroneal veins.  +---------+---------------+---------+-----------+----------+--------------+ LEFT     CompressibilityPhasicitySpontaneityPropertiesThrombus Aging +---------+---------------+---------+-----------+----------+--------------+ CFV      Full           Yes      Yes                                 +---------+---------------+---------+-----------+----------+--------------+ SFJ      Full                                                        +---------+---------------+---------+-----------+----------+--------------+  FV Prox  Full                                                        +---------+---------------+---------+-----------+----------+--------------+ FV Mid   Full                                                        +---------+---------------+---------+-----------+----------+--------------+ FV DistalFull                                                        +---------+---------------+---------+-----------+----------+--------------+ PFV      Full                                                        +---------+---------------+---------+-----------+----------+--------------+ POP      Full           Yes      Yes                                 +---------+---------------+---------+-----------+----------+--------------+ PTV      Full                                                        +---------+---------------+---------+-----------+----------+--------------+ PERO     Full                                                        +---------+---------------+---------+-----------+----------+--------------+   Left Technical Findings: Not visualized segments include limited evaluation left PTV, peroneal veins.   Summary: RIGHT: - There is no evidence of deep vein thrombosis in  the lower extremity. However, portions of this examination were limited- see technologist comments above.  - No cystic structure found in the popliteal fossa.  LEFT: - There is no evidence of deep vein thrombosis in the lower extremity. However, portions of this examination were limited- see technologist comments above.  - No cystic structure found in the popliteal fossa.  *See table(s) above for measurements and observations.    Preliminary     Procedures Procedures (including critical care time)  Medications Ordered in ED Medications  hydrochlorothiazide (HYDRODIURIL) tablet 25 mg (has no administration in time range)    ED Course  I have reviewed the triage vital signs and the nursing notes.  Pertinent labs & imaging results that were available during my care of the patient were reviewed by me and considered in my medical decision making (see chart for  details).    MDM Rules/Calculators/A&P                          MDM: Pt denies a history of high blood pressure.  Pt's records reviewed.  Pt has a history of htn,  Pt was previously on hctz.  Final Clinical Impression(s) / ED Diagnoses Final diagnoses:  Leg swelling    Rx / DC Orders ED Discharge Orders         Ordered    hydrochlorothiazide (HYDRODIURIL) 25 MG tablet  Daily        01/08/20 2125        An After Visit Summary was printed and given to the patient.    Sidney Ace 01/08/20 2126    Truddie Hidden, MD 01/08/20 2128

## 2020-01-08 NOTE — Progress Notes (Signed)
Bilateral lower extremity venous duplex completed.  Refer to "CV Proc" under chart review to view preliminary results.  01/08/2020 4:33 PM Kelby Aline., MHA, RVT, RDCS, RDMS

## 2020-01-08 NOTE — ED Provider Notes (Signed)
Assisting with triage in the ED waiting room.  Patient with one week of bilateral lower extremity edema, 3+, tense, up to the knees bilaterally.  No orthopnea, no chest pain,   Will get bilateral LE dopppler studies to R/O DVT.  Check Cmet for albumin, protein level.  Doreen Salvage, MD   Judeth Horn, MD 01/08/20 [phone]

## 2020-01-08 NOTE — ED Notes (Signed)
Pt provided [place] sandwich & drink per request

## 2020-03-17 ENCOUNTER — Other Ambulatory Visit: Payer: Self-pay

## 2020-03-17 ENCOUNTER — Emergency Department (HOSPITAL_COMMUNITY): Payer: Self-pay

## 2020-03-17 ENCOUNTER — Emergency Department (HOSPITAL_COMMUNITY)
Admission: EM | Admit: 2020-03-17 | Discharge: 2020-03-18 | Disposition: A | Discharge status: Home / Self Care | Payer: Self-pay | Attending: Emergency Medicine | Admitting: Emergency Medicine

## 2020-03-17 ENCOUNTER — Encounter (HOSPITAL_COMMUNITY): Payer: Self-pay | Admitting: Emergency Medicine

## 2020-03-17 DIAGNOSIS — R42 Dizziness and giddiness: Secondary | ICD-10-CM | POA: Insufficient documentation

## 2020-03-17 DIAGNOSIS — I10 Essential (primary) hypertension: Secondary | ICD-10-CM | POA: Insufficient documentation

## 2020-03-17 DIAGNOSIS — R0602 Shortness of breath: Secondary | ICD-10-CM | POA: Insufficient documentation

## 2020-03-17 LAB — CBC WITH DIFFERENTIAL/PLATELET
Abs Immature Granulocytes: 0.03 10*3/uL (ref 0.00–0.07)
Basophils Absolute: 0 10*3/uL (ref 0.0–0.1)
Basophils Relative: 0 %
Eosinophils Absolute: 0.2 10*3/uL (ref 0.0–0.5)
Eosinophils Relative: 2 %
HCT: 42.3 % (ref 36.0–46.0)
Hemoglobin: 12.9 g/dL (ref 12.0–15.0)
Immature Granulocytes: 0 %
Lymphocytes Relative: 25 %
Lymphs Abs: 2.5 10*3/uL (ref 0.7–4.0)
MCH: 26 pg (ref 26.0–34.0)
MCHC: 30.5 g/dL (ref 30.0–36.0)
MCV: 85.1 fL (ref 80.0–100.0)
Monocytes Absolute: 0.7 10*3/uL (ref 0.1–1.0)
Monocytes Relative: 7 %
Neutro Abs: 6.4 10*3/uL (ref 1.7–7.7)
Neutrophils Relative %: 66 %
Platelets: 294 10*3/uL (ref 150–400)
RBC: 4.97 MIL/uL (ref 3.87–5.11)
RDW: 14.6 % (ref 11.5–15.5)
WBC: 9.8 10*3/uL (ref 4.0–10.5)
nRBC: 0 % (ref 0.0–0.2)

## 2020-03-17 LAB — I-STAT BETA HCG BLOOD, ED (MC, WL, AP ONLY): I-stat hCG, quantitative: <5 m[IU]/mL (ref <5)

## 2020-03-18 ENCOUNTER — Encounter (HOSPITAL_COMMUNITY): Payer: Self-pay | Admitting: Emergency Medicine

## 2020-03-18 ENCOUNTER — Emergency Department (HOSPITAL_COMMUNITY): Payer: Self-pay

## 2020-03-18 LAB — COMPREHENSIVE METABOLIC PANEL
ALT: 18 U/L (ref 0–44)
AST: 17 U/L (ref 15–41)
Albumin: 3.6 g/dL (ref 3.5–5.0)
Alkaline Phosphatase: 68 U/L (ref 38–126)
Anion gap: 9 (ref 5–15)
BUN: 12 mg/dL (ref 6–20)
CO2: 25 mmol/L (ref 22–32)
Calcium: 9 mg/dL (ref 8.9–10.3)
Chloride: 105 mmol/L (ref 98–111)
Creatinine, Ser: 0.74 mg/dL (ref 0.44–1.00)
GFR, Estimated: >60 mL/min (ref >60)
Glucose, Bld: 99 mg/dL (ref 70–99)
Potassium: 3.9 mmol/L (ref 3.5–5.1)
Sodium: 139 mmol/L (ref 135–145)
Total Bilirubin: 0.6 mg/dL (ref 0.3–1.2)
Total Protein: 7.6 g/dL (ref 6.5–8.1)

## 2020-03-18 LAB — TROPONIN I (HIGH SENSITIVITY)
Troponin I (High Sensitivity): 5 ng/L (ref <18)
Troponin I (High Sensitivity): 3 ng/L (ref <18)

## 2020-03-18 MED ORDER — IOHEXOL 350 MG/ML SOLN
75.0000 mL | Freq: Once | INTRAVENOUS | Status: AC | PRN
  Administered 2020-03-18: 75 mL via INTRAVENOUS

## 2020-03-18 NOTE — ED Notes (Addendum)
Pt refusing covid swab X2. Pt quotes "I know the issue is my heart, I dont have covid."

## 2020-03-18 NOTE — ED Notes (Signed)
Pt transported to CT ?

## 2020-03-18 NOTE — ED Provider Notes (Signed)
[facility] EMERGENCY DEPARTMENT Provider Note   CSN: [id number] Arrival date & time: 03/17/20  2252     History Chief Complaint  Patient presents with  . SOB/Lightheaded    Emma Shepherd is a 54 y.o. female.  The history is provided by the patient.  Shortness of Breath Severity:  Moderate Onset quality:  Gradual Duration:  4 days Timing:  Constant Progression:  Waxing and waning Chronicity:  New Context: not activity and not fumes   Relieved by:  Nothing Worsened by:  Nothing Ineffective treatments:  None tried Associated symptoms: no abdominal pain, no chest pain, no fever, no neck pain and no wheezing   Associated symptoms comment:  Feels lightheaded  Risk factors: no recent surgery   Patient with Bipolar presents with SOB and lightheadedness for 3.5 days.  No chest pain.  No n/v/d.  No leg pain nor swelling.       Past Medical History:  Diagnosis Date  . Anxiety   . Hypertension 05/2013  . Obesity     Patient Active Problem List   Diagnosis Date Noted  . Chronic left-sided low back pain with left-sided sciatica 12/13/2016  . Excessive bleeding in premenopausal period 12/13/2016  . Tinea pedis of both feet 12/13/2016  . Prediabetes 12/13/2016  . Pelvic pain 02/15/2016  . Severe obesity (BMI >= 40) ([place]) 02/15/2016  . Bipolar affective disorder, currently manic, severe, with psychotic features ([place]) 10/15/2014  . HTN (hypertension) 08/06/2014    History reviewed. No pertinent surgical history.   OB History   No obstetric history on file.     History reviewed. No pertinent family history.  Social History   Tobacco Use  . Smoking status: Never Smoker  . Smokeless tobacco: Never Used  Vaping Use  . Vaping Use: Never used  Substance Use Topics  . Alcohol use: Yes    Comment: social  . Drug use: No    Home Medications Prior to Admission medications   Medication Sig Start Date End Date Taking? Authorizing Provider    hydrochlorothiazide (HYDRODIURIL) 25 MG tablet Take 1 tablet (25 mg total) by mouth daily. 01/08/20   Fransico Meadow, PA-C    Allergies    Patient has no known allergies.  Review of Systems   Review of Systems  Constitutional: Negative for fever.  HENT: Negative for congestion.   Eyes: Negative for visual disturbance.  Respiratory: Positive for shortness of breath. Negative for wheezing.   Cardiovascular: Negative for chest pain, palpitations and leg swelling.  Gastrointestinal: Negative for abdominal pain.  Genitourinary: Negative for difficulty urinating.  Musculoskeletal: Negative for neck pain.  Neurological: Positive for light-headedness.  Psychiatric/Behavioral: Negative for suicidal ideas.  All other systems reviewed and are negative.   Physical Exam Updated Vital Signs BP (!) 146/87   Pulse 68   Temp 98.1 F (36.7 C) (Oral)   Resp 19   Ht 5\' 2"  (1.575 m)   Wt 135 kg   LMP 03/08/2020   SpO2 100%   BMI 54.44 kg/m   Physical Exam Vitals and nursing note reviewed.  Constitutional:      Appearance: Normal appearance. She is not ill-appearing.  HENT:     Head: Normocephalic and atraumatic.     Nose: Nose normal.  Eyes:     Conjunctiva/sclera: Conjunctivae normal.     Pupils: Pupils are equal, round, and reactive to light.  Cardiovascular:     Rate and Rhythm: Normal rate and regular rhythm.  Pulses: Normal pulses.     Heart sounds: Normal heart sounds.  Pulmonary:     Effort: Pulmonary effort is normal.     Breath sounds: Normal breath sounds.  Abdominal:     General: Abdomen is flat. Bowel sounds are normal.     Palpations: Abdomen is soft.     Tenderness: There is no abdominal tenderness. There is no guarding.  Musculoskeletal:        General: Normal range of motion.     Cervical back: Normal range of motion and neck supple.  Skin:    General: Skin is warm and dry.     Capillary Refill: Capillary refill takes less than 2 seconds.  Neurological:      General: No focal deficit present.     Mental Status: She is alert and oriented to person, place, and time.     Deep Tendon Reflexes: Reflexes normal.  Psychiatric:        Behavior: Behavior is aggressive.     ED Results / Procedures / Treatments   Labs (all labs ordered are listed, but only abnormal results are displayed) Labs Reviewed  CBC WITH DIFFERENTIAL/PLATELET  COMPREHENSIVE METABOLIC PANEL  I-STAT BETA HCG BLOOD, ED ([facility], [facility], AP ONLY)  TROPONIN I (HIGH SENSITIVITY)  TROPONIN I (HIGH SENSITIVITY)    EKG EKG Interpretation  Date/Time:  Wednesday March 17 2020 23:03:56 EDT Ventricular Rate:  83 PR Interval:  130 QRS Duration: 88 QT Interval:  406 QTC Calculation: 477 R Axis:   86 Text Interpretation: Normal sinus rhythm Non specific st t changes Reconfirmed by Randal Buba, Murat Rideout ([id number]) on 03/18/2020 1:34:18 AM   Radiology DG Chest 2 View  Result Date: 03/17/2020 CLINICAL DATA:  Shortness of breath and dizziness EXAM: CHEST - 2 VIEW COMPARISON:  01/08/2020 FINDINGS: The heart size and mediastinal contours are within normal limits. Both lungs are clear. The visualized skeletal structures are unremarkable. IMPRESSION: No active cardiopulmonary disease. Electronically Signed   By: Inez Catalina M.D.   On: 03/17/2020 23:31   CT Angio Chest PE W and/or Wo Contrast  Result Date: 03/18/2020 CLINICAL DATA:  Chest pain, shortness of breath, heart palpitations EXAM: CT ANGIOGRAPHY CHEST WITH CONTRAST TECHNIQUE: Multidetector CT imaging of the chest was performed using the standard protocol during bolus administration of intravenous contrast. Multiplanar CT image reconstructions and MIPs were obtained to evaluate the vascular anatomy. CONTRAST:  59mL OMNIPAQUE IOHEXOL 350 MG/ML SOLN COMPARISON:  None. FINDINGS: Cardiovascular: No filling defects in the pulmonary arteries to suggest pulmonary emboli. Heart is normal size. Aorta is normal caliber. Mediastinum/Nodes: No mediastinal,  hilar, or axillary adenopathy. Trachea and esophagus are unremarkable. Thyroid unremarkable. Lungs/Pleura: Lungs are clear. No focal airspace opacities or suspicious nodules. No effusions. Upper Abdomen: Imaging into the upper abdomen demonstrates no acute findings. Musculoskeletal: Chest wall soft tissues are unremarkable. No acute bony abnormality. Review of the MIP images confirms the above findings. IMPRESSION: No evidence of pulmonary embolus. No acute cardiopulmonary disease. Electronically Signed   By: Rolm Baptise M.D.   On: 03/18/2020 03:00    Procedures Procedures (including critical care time)  Medications Ordered in ED Medications  iohexol (OMNIPAQUE) 350 MG/ML injection 75 mL (75 mLs Intravenous Contrast Given 03/18/20 0241)    ED Course  I have reviewed the triage vital signs and the nursing notes.  Pertinent labs & imaging results that were available during my care of the patient were reviewed by me and considered in my medical decision making (see chart for  details).   She is aggressive stating " I know this is my heart"  EDP stated we were awaiting testing.     Symptoms ongoing for 3.5 days, based on time course.  One unchanged EKG and negative troponin excludes ACS.  HEart score is 1, very low risk for MACE.  Ruled out for PE in the ED>  I discussed covid swabbing with the patient and she was amenable though she refused after I left.  She then stated she would undergo testing again as this is my concern why she is having these symptoms and she is unvaccinated.  Patient again told me she would undergo testing and then told the nurse she would not accept testing.  Patient is told to quarantine at home and test as an outpatient.  She is stable for discharge with close follow up  Joey Hudock was evaluated in Emergency Department on 03/18/2020 for the symptoms described in the history of present illness. She was evaluated in the context of the global COVID-19 pandemic, which  necessitated consideration that the patient might be at risk for infection with the SARS-CoV-2 virus that causes COVID-19. Institutional protocols and algorithms that pertain to the evaluation of patients at risk for COVID-19 are in a state of rapid change based on information released by regulatory bodies including the CDC and federal and state organizations. These policies and algorithms were followed during the patient's care in the ED.  Final Clinical Impression(s) / ED Diagnoses Return for intractable cough, coughing up blood,fevers >100.4 unrelieved by medication, shortness of breath, intractable vomiting, chest pain, shortness of breath, weakness,numbness, changes in speech, facial asymmetry,abdominal pain, passing out,Inability to tolerate liquids or food, cough, altered mental status or any concerns. No signs of systemic illness or infection. The patient is nontoxic-appearing on exam and vital signs are within normal limits.   I have reviewed the triage vital signs and the nursing notes. Pertinent labs &imaging results that were available during my care of the patient were reviewed by me and considered in my medical decision making (see chart for details).After history, exam, and medical workup I feel the patient has beenappropriately medically screened and is safe for discharge home. Pertinent diagnoses were discussed with the patient. Patient was given return precautions.    Niklas Chretien, MD 03/18/20 0400

## 2020-03-20 DIAGNOSIS — I10 Essential (primary) hypertension: Secondary | ICD-10-CM | POA: Insufficient documentation

## 2020-03-20 DIAGNOSIS — K0889 Other specified disorders of teeth and supporting structures: Secondary | ICD-10-CM | POA: Insufficient documentation

## 2020-03-21 ENCOUNTER — Emergency Department (HOSPITAL_COMMUNITY)
Admission: EM | Admit: 2020-03-21 | Discharge: 2020-03-21 | Disposition: A | Discharge status: Home / Self Care | Payer: Self-pay | Attending: Emergency Medicine | Admitting: Emergency Medicine

## 2020-03-21 ENCOUNTER — Encounter (HOSPITAL_COMMUNITY): Payer: Self-pay

## 2020-03-21 ENCOUNTER — Other Ambulatory Visit: Payer: Self-pay

## 2020-03-21 DIAGNOSIS — K0889 Other specified disorders of teeth and supporting structures: Secondary | ICD-10-CM

## 2020-03-21 DIAGNOSIS — I10 Essential (primary) hypertension: Secondary | ICD-10-CM | POA: Insufficient documentation

## 2020-03-21 DIAGNOSIS — Z91120 Patient's intentional underdosing of medication regimen due to financial hardship: Secondary | ICD-10-CM | POA: Insufficient documentation

## 2020-03-21 DIAGNOSIS — Z79899 Other long term (current) drug therapy: Secondary | ICD-10-CM

## 2020-03-21 DIAGNOSIS — T3696XA Underdosing of unspecified systemic antibiotic, initial encounter: Secondary | ICD-10-CM | POA: Insufficient documentation

## 2020-03-21 MED ORDER — IBUPROFEN 800 MG PO TABS
800.0000 mg | ORAL_TABLET | Freq: Once | ORAL | Status: AC
  Administered 2020-03-21: 800 mg via ORAL
  Filled 2020-03-21: qty 1

## 2020-03-21 MED ORDER — ACETAMINOPHEN 325 MG PO TABS
650.0000 mg | ORAL_TABLET | Freq: Once | ORAL | Status: AC
  Administered 2020-03-21: 650 mg via ORAL
  Filled 2020-03-21: qty 2

## 2020-03-21 MED ORDER — PENICILLIN V POTASSIUM 500 MG PO TABS: 500.0000 mg | ORAL_TABLET | Freq: Four times a day (QID) | ORAL | 0 refills | Status: AC

## 2020-03-21 MED ORDER — PENICILLIN V POTASSIUM 250 MG PO TABS
500.0000 mg | ORAL_TABLET | Freq: Once | ORAL | Status: AC
  Administered 2020-03-21: 500 mg via ORAL
  Filled 2020-03-21: qty 2

## 2020-03-21 NOTE — ED Notes (Signed)
Patient verbalizes understanding of discharge instructions. Opportunity for questioning and answers were provided. Armband removed by staff, pt discharged from ED ambulatory.   

## 2020-03-21 NOTE — Care Management (Signed)
  [place] Medication Assistance Card Name: Bessie Boyte ID (MRN): [id number] [place]: [id number] RX Group: [id number] Discharge Date: 03/21/2020 Expiration Date:03/31/2020                                           (must be filled within 7 days of discharge)       Dear   : Emma Shepherd   You have been approved to have the prescriptions written by your discharging physician filled through our [facility] (Medication Assistance Through [facility]) program. This program allows for a one-time (no refills) 34-day supply of selected medications for a low copay amount.  The copay is $3.00 per prescription. For instance, if you have one prescription, you will pay $3.00; for two prescriptions, you pay $6.00; for three prescriptions, you pay $9.00; and so on.  Only certain pharmacies are participating in this program with [facility]. You will need to select one of the pharmacies from the attached list and take your prescriptions, this letter, and your photo ID to one of the participating pharmacies.   We are excited that you are able to use the [facility] program to get your medications. These prescriptions must be filled within 7 days of hospital discharge or they will no longer be valid for the [facility] program. Should you have any problems with your prescriptions please contact your case management team member at [phone] for [place] [facility] [facility]/ [place] you, [place] Management

## 2020-03-21 NOTE — Discharge Instructions (Signed)
Take 600mg  Advil liquid gels and 650mg  Tylenol every 6 hours as needed for pain. Take Penicillin as prescribed and complete the full course. Apply lolicaine to area as discussed for pain.  Follow up with a dentist as soon as possible, see resource list.

## 2020-03-21 NOTE — ED Provider Notes (Signed)
[facility] EMERGENCY DEPARTMENT Provider Note   CSN: [id number] Arrival date & time: 03/20/20  2238     History Chief Complaint  Patient presents with  . Dental Pain    Emma Shepherd is a 54 y.o. female.  54 year old female with complaint of right side jaw pain with pain in the right lower teeth, aching in nature x 3 months. Radiates to TMJ area. Pain improved while in the lobby waiting. Pain is worse with eating, cold temps. No relief with IBU. Does not have a dentist. Denies fevers, trauma, drainage.         Past Medical History:  Diagnosis Date  . Anxiety   . Hypertension 05/2013  . Obesity     Patient Active Problem List   Diagnosis Date Noted  . Chronic left-sided low back pain with left-sided sciatica 12/13/2016  . Excessive bleeding in premenopausal period 12/13/2016  . Tinea pedis of both feet 12/13/2016  . Prediabetes 12/13/2016  . Pelvic pain 02/15/2016  . Severe obesity (BMI >= 40) ([place]) 02/15/2016  . Bipolar affective disorder, currently manic, severe, with psychotic features ([place]) 10/15/2014  . HTN (hypertension) 08/06/2014    No past surgical history on file.   OB History   No obstetric history on file.     No family history on file.  Social History   Tobacco Use  . Smoking status: Never Smoker  . Smokeless tobacco: Never Used  Vaping Use  . Vaping Use: Never used  Substance Use Topics  . Alcohol use: Yes    Comment: social  . Drug use: No    Home Medications Prior to Admission medications   Medication Sig Start Date End Date Taking? Authorizing Provider  hydrochlorothiazide (HYDRODIURIL) 25 MG tablet Take 1 tablet (25 mg total) by mouth daily. 01/08/20   Fransico Meadow, PA-C  penicillin v potassium (VEETID) 500 MG tablet Take 1 tablet (500 mg total) by mouth 4 (four) times daily for 10 days. 03/21/20 03/31/20  Tacy Learn, PA-C    Allergies    Patient has no known allergies.  Review of Systems   Review  of Systems  Constitutional: Negative for chills and fever.  HENT: Positive for dental problem. Negative for facial swelling, trouble swallowing and voice change.   Gastrointestinal: Negative for vomiting.  Musculoskeletal: Negative for neck pain and neck stiffness.  Skin: Negative for rash and wound.  Allergic/Immunologic: Negative for immunocompromised state.  Neurological: Negative for headaches.  Hematological: Negative for adenopathy.  Psychiatric/Behavioral: Negative for confusion.  All other systems reviewed and are negative.   Physical Exam Updated Vital Signs BP 138/80 (BP Location: Other (Comment)) Comment (BP Location): Right Forearm  Pulse 75   Temp 98 F (36.7 C) (Oral)   Resp 17   LMP 03/08/2020   SpO2 99%   Physical Exam Vitals and nursing note reviewed.  Constitutional:      General: She is not in acute distress.    Appearance: She is well-developed. She is not diaphoretic.  HENT:     Head: Normocephalic and atraumatic.     Jaw: No trismus.     Mouth/Throat:     Mouth: Mucous membranes are moist.     Dentition: Abnormal dentition. Dental caries present. No dental tenderness, gingival swelling or dental abscesses.     Pharynx: No oropharyngeal exudate or posterior oropharyngeal erythema.     Comments: No obvious abscess, multiple filings, right upper tooth with decay around filling.  Pulmonary:  Effort: Pulmonary effort is normal.  Musculoskeletal:     Cervical back: Neck supple.  Lymphadenopathy:     Cervical: No cervical adenopathy.  Skin:    General: Skin is warm and dry.     Findings: No erythema or rash.  Neurological:     Mental Status: She is alert and oriented to person, place, and time.  Psychiatric:        Behavior: Behavior normal.     ED Results / Procedures / Treatments   Labs (all labs ordered are listed, but only abnormal results are displayed) Labs Reviewed - No data to display  EKG None  Radiology No results  found.  Procedures Procedures (including critical care time)  Medications Ordered in ED Medications  penicillin v potassium (VEETID) tablet 500 mg (has no administration in time range)  ibuprofen (ADVIL) tablet 800 mg (has no administration in time range)  acetaminophen (TYLENOL) tablet 650 mg (has no administration in time range)    ED Course  I have reviewed the triage vital signs and the nursing notes.  Pertinent labs & imaging results that were available during my care of the patient were reviewed by me and considered in my medical decision making (see chart for details).  Clinical Course as of Mar 21 818  Nancy Fetter Mar 21, 2020  0817 53yo female with right lower dental pain, no obvious abscess on exam. No trismus, no fevers. Plan is to give antibiotics, recommend motrin/tylenol, given dental referral list.    [LM]    Clinical Course User Index [LM] Roque Lias   MDM Rules/Calculators/A&P                          Final Clinical Impression(s) / ED Diagnoses Final diagnoses:  Pain, dental    Rx / DC Orders ED Discharge Orders         Ordered    penicillin v potassium (VEETID) 500 MG tablet  4 times daily        03/21/20 0810           Tacy Learn, PA-C 03/21/20 7482    Blanchie Dessert, MD 03/21/20 [phone]

## 2020-03-21 NOTE — ED Provider Notes (Signed)
[facility] EMERGENCY DEPARTMENT Provider Note   CSN: [id number] Arrival date & time: 03/21/20  2038     History Chief Complaint  Patient presents with  . Dental Pain    Emma Shepherd is a 54 y.o. female presenting to the ED for medication assistance regarding antibiotic prescription provided this morning from ED visit for dental pain.  She states she is unable to afford her prescription of penicillin v potassium prescribed 500 mg tablets 4 times daily for 10 days.  No further complaints regarding dental pain.  The history is provided by the patient.       Past Medical History:  Diagnosis Date  . Anxiety   . Hypertension 05/2013  . Obesity     Patient Active Problem List   Diagnosis Date Noted  . Chronic left-sided low back pain with left-sided sciatica 12/13/2016  . Excessive bleeding in premenopausal period 12/13/2016  . Tinea pedis of both feet 12/13/2016  . Prediabetes 12/13/2016  . Pelvic pain 02/15/2016  . Severe obesity (BMI >= 40) ([place]) 02/15/2016  . Bipolar affective disorder, currently manic, severe, with psychotic features ([place]) 10/15/2014  . HTN (hypertension) 08/06/2014    History reviewed. No pertinent surgical history.   OB History   No obstetric history on file.     No family history on file.  Social History   Tobacco Use  . Smoking status: Never Smoker  . Smokeless tobacco: Never Used  Vaping Use  . Vaping Use: Never used  Substance Use Topics  . Alcohol use: Yes    Comment: social  . Drug use: No    Home Medications Prior to Admission medications   Medication Sig Start Date End Date Taking? Authorizing Provider  hydrochlorothiazide (HYDRODIURIL) 25 MG tablet Take 1 tablet (25 mg total) by mouth daily. 01/08/20   Fransico Meadow, PA-C  penicillin v potassium (VEETID) 500 MG tablet Take 1 tablet (500 mg total) by mouth 4 (four) times daily for 10 days. 03/21/20 03/31/20  Tacy Learn, PA-C    Allergies      Patient has no known allergies.  Review of Systems   Review of Systems  All other systems reviewed and are negative.   Physical Exam Updated Vital Signs BP 139/87   Pulse (!) 56   Temp 98 F (36.7 C) (Oral)   Resp 18   LMP 03/08/2020   SpO2 95%   Physical Exam Vitals and nursing note reviewed.  Constitutional:      General: She is not in acute distress.    Appearance: She is well-developed.  HENT:     Head: Normocephalic.  Eyes:     Conjunctiva/sclera: Conjunctivae normal.  Cardiovascular:     Rate and Rhythm: Normal rate.  Pulmonary:     Effort: Pulmonary effort is normal.  Neurological:     Mental Status: She is alert.  Psychiatric:        Mood and Affect: Mood normal.        Behavior: Behavior normal.     ED Results / Procedures / Treatments   Labs (all labs ordered are listed, but only abnormal results are displayed) Labs Reviewed - No data to display  EKG None  Radiology No results found.  Procedures Procedures (including critical care time)  Medications Ordered in ED Medications - No data to display  ED Course  I have reviewed the triage vital signs and the nursing notes.  Pertinent labs & imaging results that were available during  my care of the patient were reviewed by me and considered in my medical decision making (see chart for details).    MDM Rules/Calculators/A&P                          Patient presenting back to the ED for medication assistance.  Patient was seen this morning for dental pain, prescribed penicillin states she is unable to afford this.  Case management was consulted who provided [facility] assistance for financial assistance with prescription.  She has been instructed which pharmacy she can bring her medication to for [facility] program. No further complaints. Patient discharged.    Final Clinical Impression(s) / ED Diagnoses Final diagnoses:  Follow-up encounter involving medication    Rx / DC Orders ED Discharge Orders     None       Rorik Vespa, [place] N, PA-C 03/21/20 2329    Davonna Belling, MD 03/21/20 2342

## 2020-03-21 NOTE — ED Notes (Signed)
Called pt for vitals

## 2020-03-21 NOTE — ED Triage Notes (Signed)
Pt reports that she has dental pain on R lower side, seen last night for the same and reports she has no money to fill her prescription

## 2020-03-21 NOTE — Discharge Instructions (Addendum)
[facility] at corner of [address]. Your [place] letter has been sent to this pharmacy. Please follow with your primary care provider.     [place] Medication Assistance Card Name: Stefhanie Kachmar ID (MRN): [id number] [place]: [id number]                                          RX Group: [id number] Discharge Date: 03/21/2020 Expiration Date:03/31/2020                                                                                                (must be filled within 7 days of discharge)   Dear   : Lucrezia Europe    You have been approved to have the prescriptions written by your discharging physician filled through our [facility] (Medication Assistance Through [facility]) program. This program allows for a one-time (no refills) 34-day supply of selected medications for a low copay amount.   The copay is $3.00 per prescription. For instance, if you have one prescription, you will pay $3.00; for two prescriptions, you pay $6.00; for three prescriptions, you pay $9.00; and so on.   Only certain pharmacies are participating in this program with [facility]. You will need to select one of the pharmacies from the attached list and take your prescriptions, this letter, and your photo ID to one of the participating pharmacies.    We are excited that you are able to use the [facility] program to get your medications. These prescriptions must be filled within 7 days of hospital discharge or they will no longer be valid for the [facility] program. Should you have any problems with your prescriptions please contact your case management team member at [phone] for [place] [address] [place] [facility]/ [place] you, [place] Management

## 2020-03-21 NOTE — Care Management (Addendum)
ED CM received consult concerning medication assistance.  Patient was discharged from the [facility] ED today with prescription for antibiotics for dental infection, patient was not able to afford the prescription so was told to return to the ED. CM spoke with patient in the waiting room discussed [place] program and guidelines, patient states she only has 50 cents at this time.  CM enrolled patient and waived the co-pay. CM explained that [facility], and she will not have a co-pay. Updated Emma Billow RN and EDP covering  triage. No further ED CM needs identified

## 2020-03-21 NOTE — ED Triage Notes (Signed)
Pt said she has been having lower right jaw pain that is in the bone under one of her teeth. Pt said some swelling in her gum and hard to chew

## 2020-03-24 ENCOUNTER — Emergency Department (HOSPITAL_COMMUNITY)
Admission: EM | Admit: 2020-03-24 | Discharge: 2020-03-25 | Disposition: A | Discharge status: Home / Self Care | Payer: Self-pay | Attending: Emergency Medicine | Admitting: Emergency Medicine

## 2020-03-24 ENCOUNTER — Encounter (HOSPITAL_COMMUNITY): Payer: Self-pay

## 2020-03-24 ENCOUNTER — Emergency Department (HOSPITAL_COMMUNITY): Payer: Self-pay

## 2020-03-24 DIAGNOSIS — R609 Edema, unspecified: Secondary | ICD-10-CM

## 2020-03-24 DIAGNOSIS — R600 Localized edema: Secondary | ICD-10-CM

## 2020-03-24 DIAGNOSIS — I10 Essential (primary) hypertension: Secondary | ICD-10-CM | POA: Insufficient documentation

## 2020-03-24 DIAGNOSIS — Z79899 Other long term (current) drug therapy: Secondary | ICD-10-CM | POA: Insufficient documentation

## 2020-03-24 LAB — CBC
HCT: 41.9 % (ref 36.0–46.0)
Hemoglobin: 12.7 g/dL (ref 12.0–15.0)
MCH: 26.5 pg (ref 26.0–34.0)
MCHC: 30.3 g/dL (ref 30.0–36.0)
MCV: 87.3 fL (ref 80.0–100.0)
Platelets: 276 10*3/uL (ref 150–400)
RBC: 4.8 MIL/uL (ref 3.87–5.11)
RDW: 14.9 % (ref 11.5–15.5)
WBC: 9.6 10*3/uL (ref 4.0–10.5)
nRBC: 0 % (ref 0.0–0.2)

## 2020-03-24 LAB — BASIC METABOLIC PANEL
Anion gap: 11 (ref 5–15)
BUN: 10 mg/dL (ref 6–20)
CO2: 27 mmol/L (ref 22–32)
Calcium: 9 mg/dL (ref 8.9–10.3)
Chloride: 101 mmol/L (ref 98–111)
Creatinine, Ser: 0.71 mg/dL (ref 0.44–1.00)
GFR, Estimated: >60 mL/min (ref >60)
Glucose, Bld: 113 mg/dL — ABNORMAL HIGH (ref 70–99)
Potassium: 3.7 mmol/L (ref 3.5–5.1)
Sodium: 139 mmol/L (ref 135–145)

## 2020-03-24 LAB — BRAIN NATRIURETIC PEPTIDE: B Natriuretic Peptide: 31.4 pg/mL (ref 0.0–100.0)

## 2020-03-24 NOTE — ED Triage Notes (Signed)
Pt reports that she has bilateral leg swelling for the past few days with some exertional SOB

## 2020-03-25 MED ORDER — FUROSEMIDE 40 MG PO TABS: 40.0000 mg | ORAL_TABLET | Freq: Every day | ORAL | 0 refills | Status: AC

## 2020-03-25 NOTE — ED Provider Notes (Signed)
[facility] EMERGENCY DEPARTMENT Provider Note   CSN: [id number] Arrival date & time: 03/24/20  1905     History Chief Complaint  Patient presents with  . Leg Swelling    Emma Shepherd is a 54 y.o. female.  Patient with past medical history notable for hypertension, bipolar, presents to the emergency department with chief complaint of leg swelling.  She states that she noticed the symptoms about a week ago.  She denies any chest pain or shortness of breath, but per nursing note, she did report some exertional shortness of breath few days ago.  She has not had the symptoms before.  She denies any treatments prior to arrival.  Nothing makes his symptoms better or worse.  She denies any heart problems.        Past Medical History:  Diagnosis Date  . Anxiety   . Hypertension 05/2013  . Obesity     Patient Active Problem List   Diagnosis Date Noted  . Chronic left-sided low back pain with left-sided sciatica 12/13/2016  . Excessive bleeding in premenopausal period 12/13/2016  . Tinea pedis of both feet 12/13/2016  . Prediabetes 12/13/2016  . Pelvic pain 02/15/2016  . Severe obesity (BMI >= 40) ([place]) 02/15/2016  . Bipolar affective disorder, currently manic, severe, with psychotic features ([place]) 10/15/2014  . HTN (hypertension) 08/06/2014    History reviewed. No pertinent surgical history.   OB History   No obstetric history on file.     No family history on file.  Social History   Tobacco Use  . Smoking status: Never Smoker  . Smokeless tobacco: Never Used  Vaping Use  . Vaping Use: Never used  Substance Use Topics  . Alcohol use: Yes    Comment: social  . Drug use: No    Home Medications Prior to Admission medications   Medication Sig Start Date End Date Taking? Authorizing Provider  hydrochlorothiazide (HYDRODIURIL) 25 MG tablet Take 1 tablet (25 mg total) by mouth daily. 01/08/20   Fransico Meadow, PA-C  penicillin v potassium  (VEETID) 500 MG tablet Take 1 tablet (500 mg total) by mouth 4 (four) times daily for 10 days. 03/21/20 03/31/20  Tacy Learn, PA-C    Allergies    Patient has no known allergies.  Review of Systems   Review of Systems  All other systems reviewed and are negative.   Physical Exam Updated Vital Signs BP (!) 142/78 (BP Location: Right Arm)   Pulse 82   Temp 98.5 F (36.9 C) (Oral)   Resp 14   LMP 03/08/2020   SpO2 100%   Physical Exam Vitals and nursing note reviewed.  Constitutional:      General: She is not in acute distress.    Appearance: She is well-developed.  HENT:     Head: Normocephalic and atraumatic.  Eyes:     Conjunctiva/sclera: Conjunctivae normal.  Cardiovascular:     Rate and Rhythm: Normal rate and regular rhythm.     Heart sounds: No murmur heard.   Pulmonary:     Effort: Pulmonary effort is normal. No respiratory distress.     Breath sounds: Normal breath sounds. No stridor. No wheezing, rhonchi or rales.     Comments: Clear to auscultation bilaterally Chest:     Chest wall: No tenderness.  Abdominal:     Palpations: Abdomen is soft.     Tenderness: There is no abdominal tenderness.  Musculoskeletal:     Cervical back: Neck supple.  Comments: 1+ pitting edema in bilateral lower extremities  Skin:    General: Skin is warm and dry.  Neurological:     Mental Status: She is alert.     ED Results / Procedures / Treatments   Labs (all labs ordered are listed, but only abnormal results are displayed) Labs Reviewed  BASIC METABOLIC PANEL - Abnormal; Notable for the following components:      Result Value   Glucose, Bld 113 (*)    All other components within normal limits  CBC  BRAIN NATRIURETIC PEPTIDE    EKG EKG Interpretation  Date/Time:  Wednesday March 24 2020 19:22:00 EST Ventricular Rate:  97 PR Interval:  132 QRS Duration: 92 QT Interval:  372 QTC Calculation: 472 R Axis:   -60 Text Interpretation: Normal sinus rhythm  Left axis deviation Pulmonary disease pattern Incomplete right bundle branch block Nonspecific ST and T wave abnormality Abnormal ECG When compared with ECG of 03/17/2020, No significant change was found Confirmed by Delora Fuel ([id number]) on 03/24/2020 11:12:45 PM   Radiology DG Chest 2 View  Result Date: 03/24/2020 CLINICAL DATA:  54 year old female with shortness of breath. EXAM: CHEST - 2 VIEW COMPARISON:  Chest radiograph dated 03/17/2020 FINDINGS: No focal consolidation, pleural effusion or pneumothorax. The cardiac silhouette is within limits. No acute osseous pathology. IMPRESSION: No active cardiopulmonary disease. Electronically Signed   By: Anner Crete M.D.   On: 03/24/2020 19:54    Procedures Procedures (including critical care time)  Medications Ordered in ED Medications - No data to display  ED Course  I have reviewed the triage vital signs and the nursing notes.  Pertinent labs & imaging results that were available during my care of the patient were reviewed by me and considered in my medical decision making (see chart for details).    MDM Rules/Calculators/A&P                          Patient with bilateral leg swelling.  She has had the symptoms for the past few days.  She denies history of CHF.  Denies any kidney problems.  She doesn't have any chest pain or shortness of breath now, but did report some exertional SOB from a few days back.  Chest x-ray is interpreted by me, shows no obvious abnormality.  No pleural effusion.  Laboratory work-up ordered in triage is notable for normal BNP, no electrolyte derangements, normal creatinine, normal CBC.  No acutely ischemic changes on EKG, EKG appears similar to the past.  I discussed with the patient that her labs and work-up are fairly reassuring tonight.  Discussed that while I cannot identify the exact cause of her leg swelling, I will treat her with a few days of Lasix.  I am suspicious for CHF.  I have advised  patient that she follow-up with her doctor for further work-up.  She states that she will make an appointment with community health and wellness.  Return precautions discussed.  Final Clinical Impression(s) / ED Diagnoses Final diagnoses:  Peripheral edema    Rx / DC Orders ED Discharge Orders         Ordered    furosemide (LASIX) 40 MG tablet  Daily        03/25/20 0112           Montine Circle, PA-C 32/20/25 [id number]    Delora Fuel, MD 62/37/62 [phone]

## 2021-08-18 IMAGING — CT CT HEAD W/O CM
4 series · 16 of 47 positions shown, 18 images · non-contrast
Comparison: None.

CLINICAL DATA: Headache, hypertension

EXAM:
CT HEAD WITHOUT CONTRAST
TECHNIQUE: Contiguous axial images were obtained from the base of the skull
through the vertex without intravenous contrast.

[Series 3: head wo · axial · 0.40mm/px · z∈[-156,-36]mm · 7 of 33 slices shown, 9 images]
[im 5/33  brain]
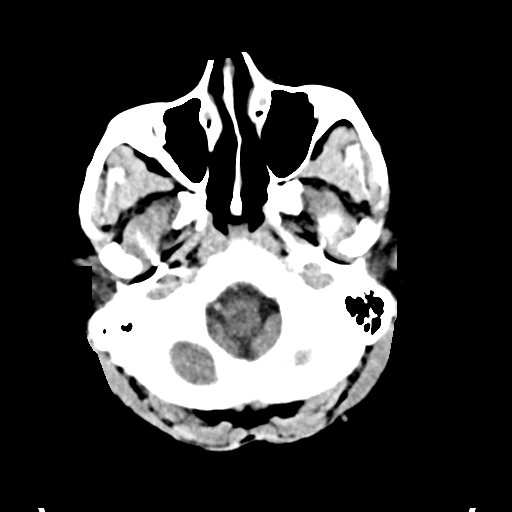
[im 5/33  bone]
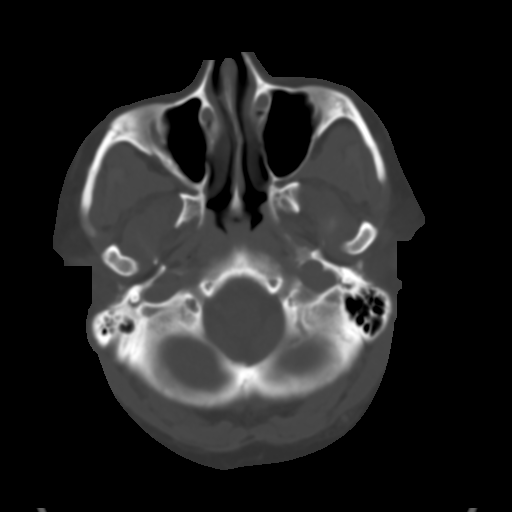
[im 9/33  brain]
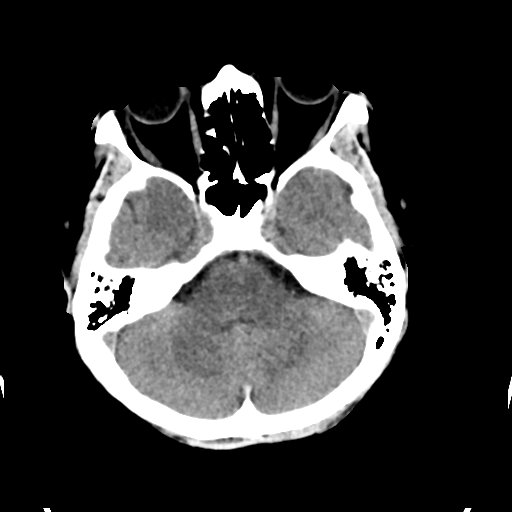
[im 13/33  brain]
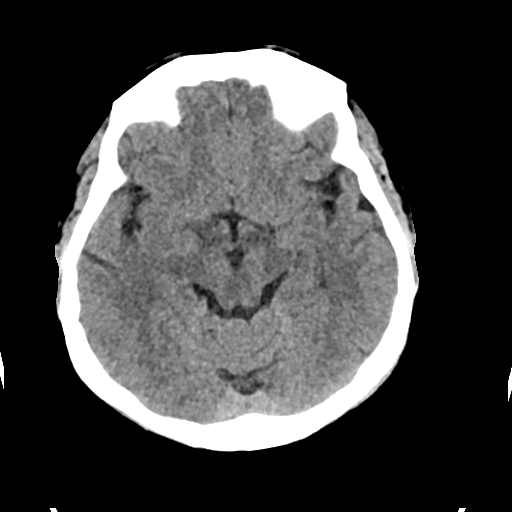
[im 17/33  brain]
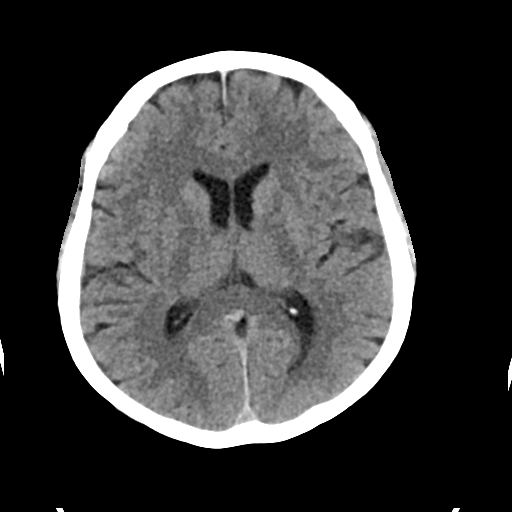
[im 21/33  brain]
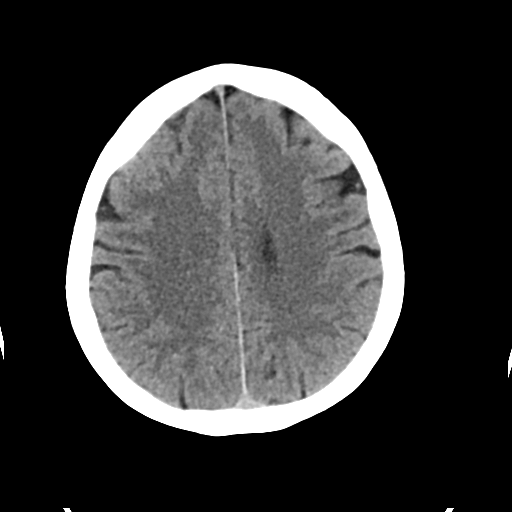
[im 21/33  bone]
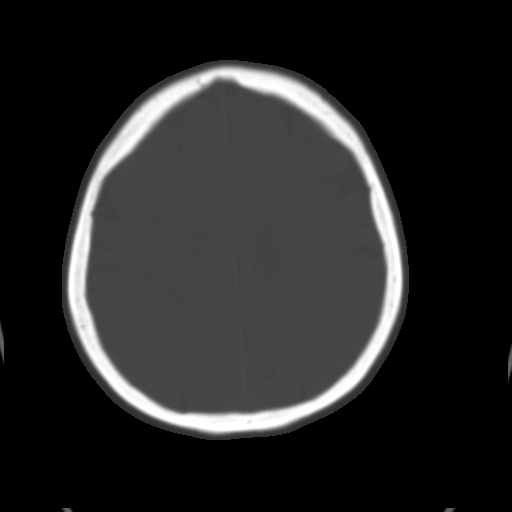
[im 25/33  brain]
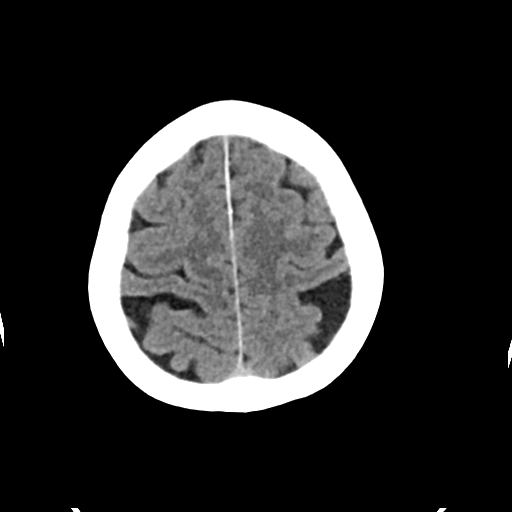
[im 29/33  brain]
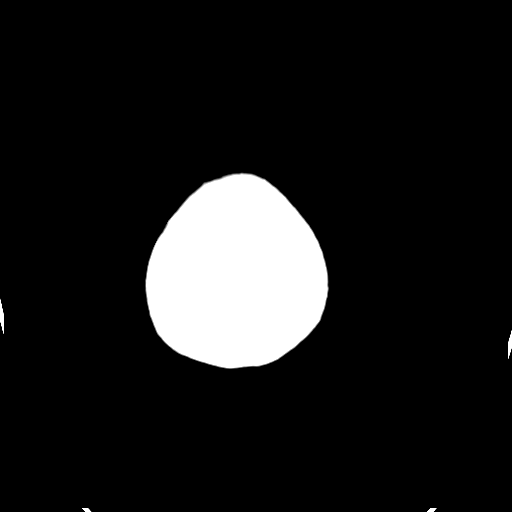

[Series 4: head bone · axial · 0.40mm/px · z∈[-160,-128]mm · 3 of 83 slices shown]
[im 9/83  bone]
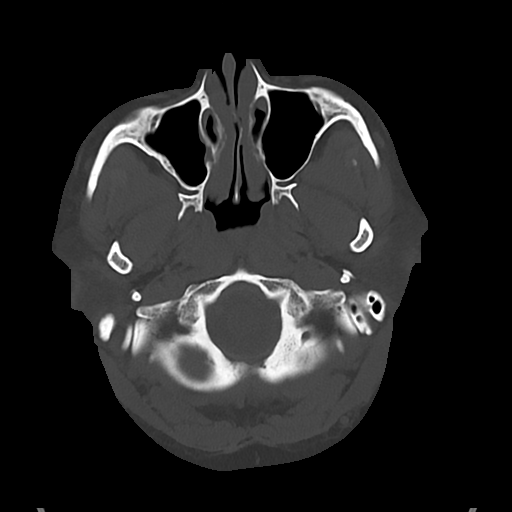
[im 17/83  bone]
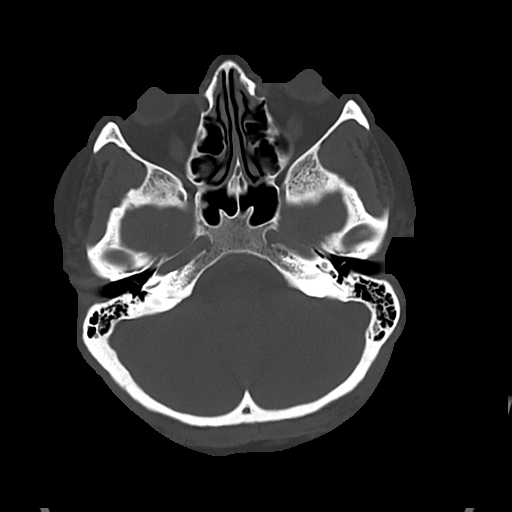
[im 25/83  bone]
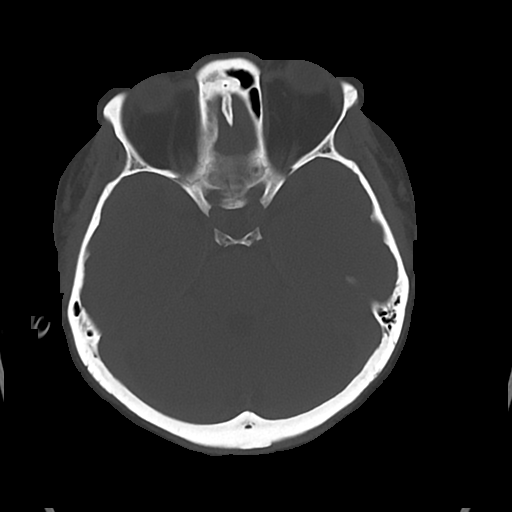

[Series 5: cor soft · coronal · 0.29mm/px · 3 of 64 slices shown]
[im 22/64  brain]
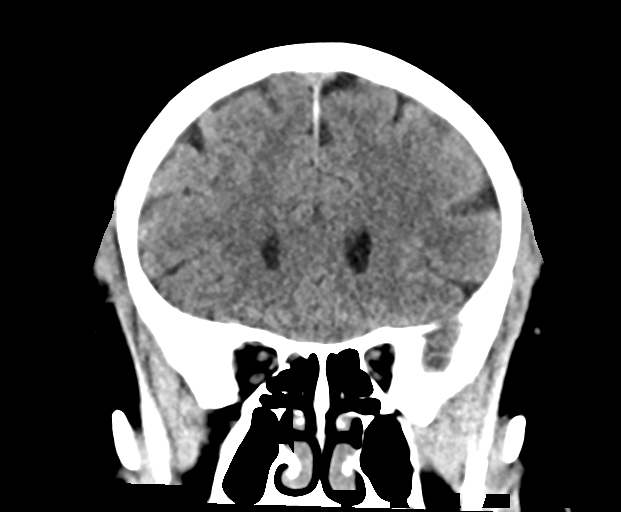
[im 29/64  brain]
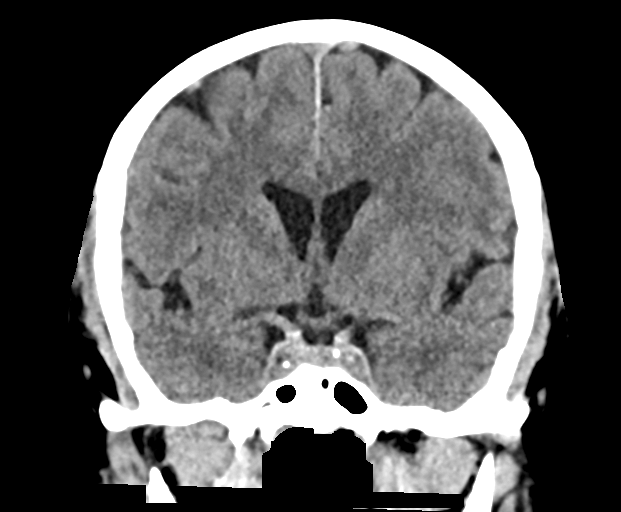
[im 36/64  brain]
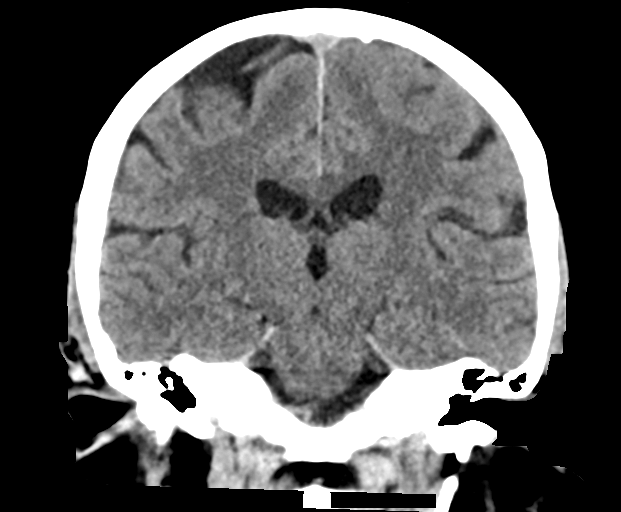

[Series 6: sag soft · sagittal · 0.29mm/px · 3 of 62 slices shown]
[im 21/62  brain]
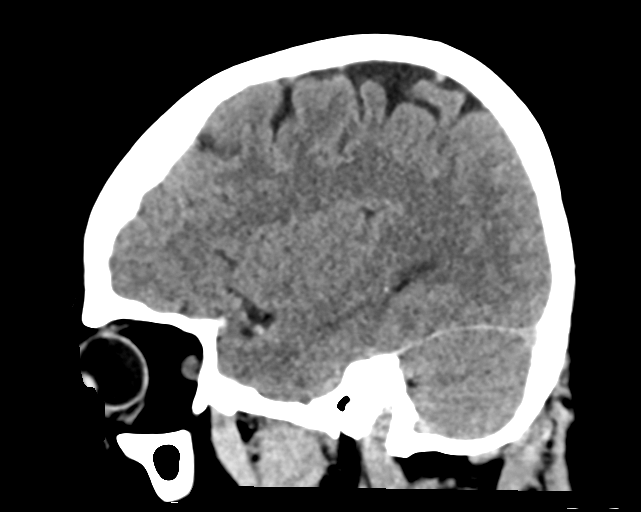
[im 31/62  brain]
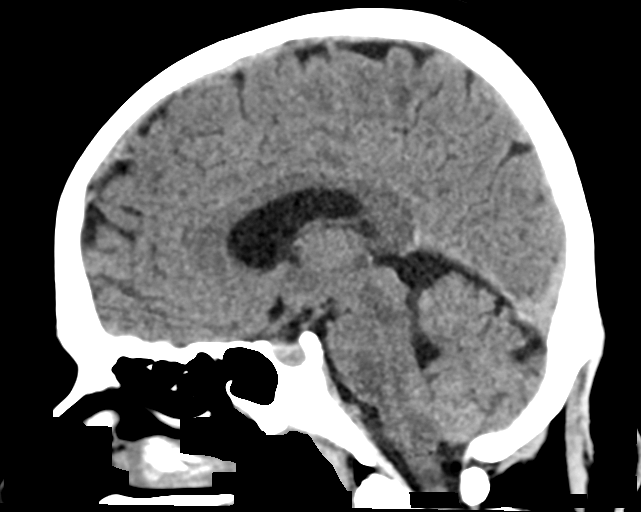
[im 41/62  brain]
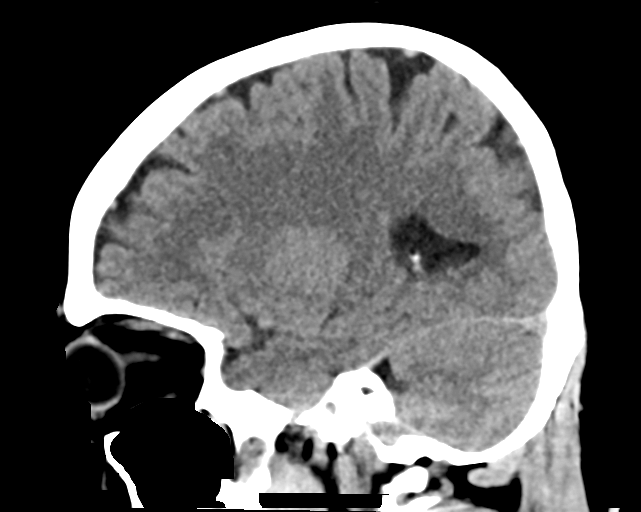

[16 of 47 positions shown; findings below may reference images not displayed]

FINDINGS: Brain: A small right parietal convexity cystic hygroma or arachnoid
cyst is suspected at the vertex. There is minimal mass effect upon
the right parietal lobe. No abnormal intra or extra-axial mass
lesion. No abnormal mass effect or midline shift. No evidence of
acute intracranial hemorrhage or infarct. Ventricular size is
normal. Cerebellum unremarkable.

Vascular: Unremarkable

Skull: Intact

Sinuses/Orbits: Paranasal sinuses are clear. There is a remote
fracture of the right medial orbital wall. The orbits are otherwise
unremarkable.

Other: Mastoid air cells and middle ear cavities are clear.
IMPRESSION: 1. No acute intracranial abnormality.
2. Small right parietal convexity cystic hygroma or arachnoid cyst.

## 2021-11-05 IMAGING — DX DG CHEST 2V
2 series · 2 of 2 positions shown · non-contrast
Comparison: Chest radiograph dated 03/17/2020

CLINICAL DATA: 53-year-old female with shortness of breath.

EXAM:
CHEST - 2 VIEW

[chest pa]
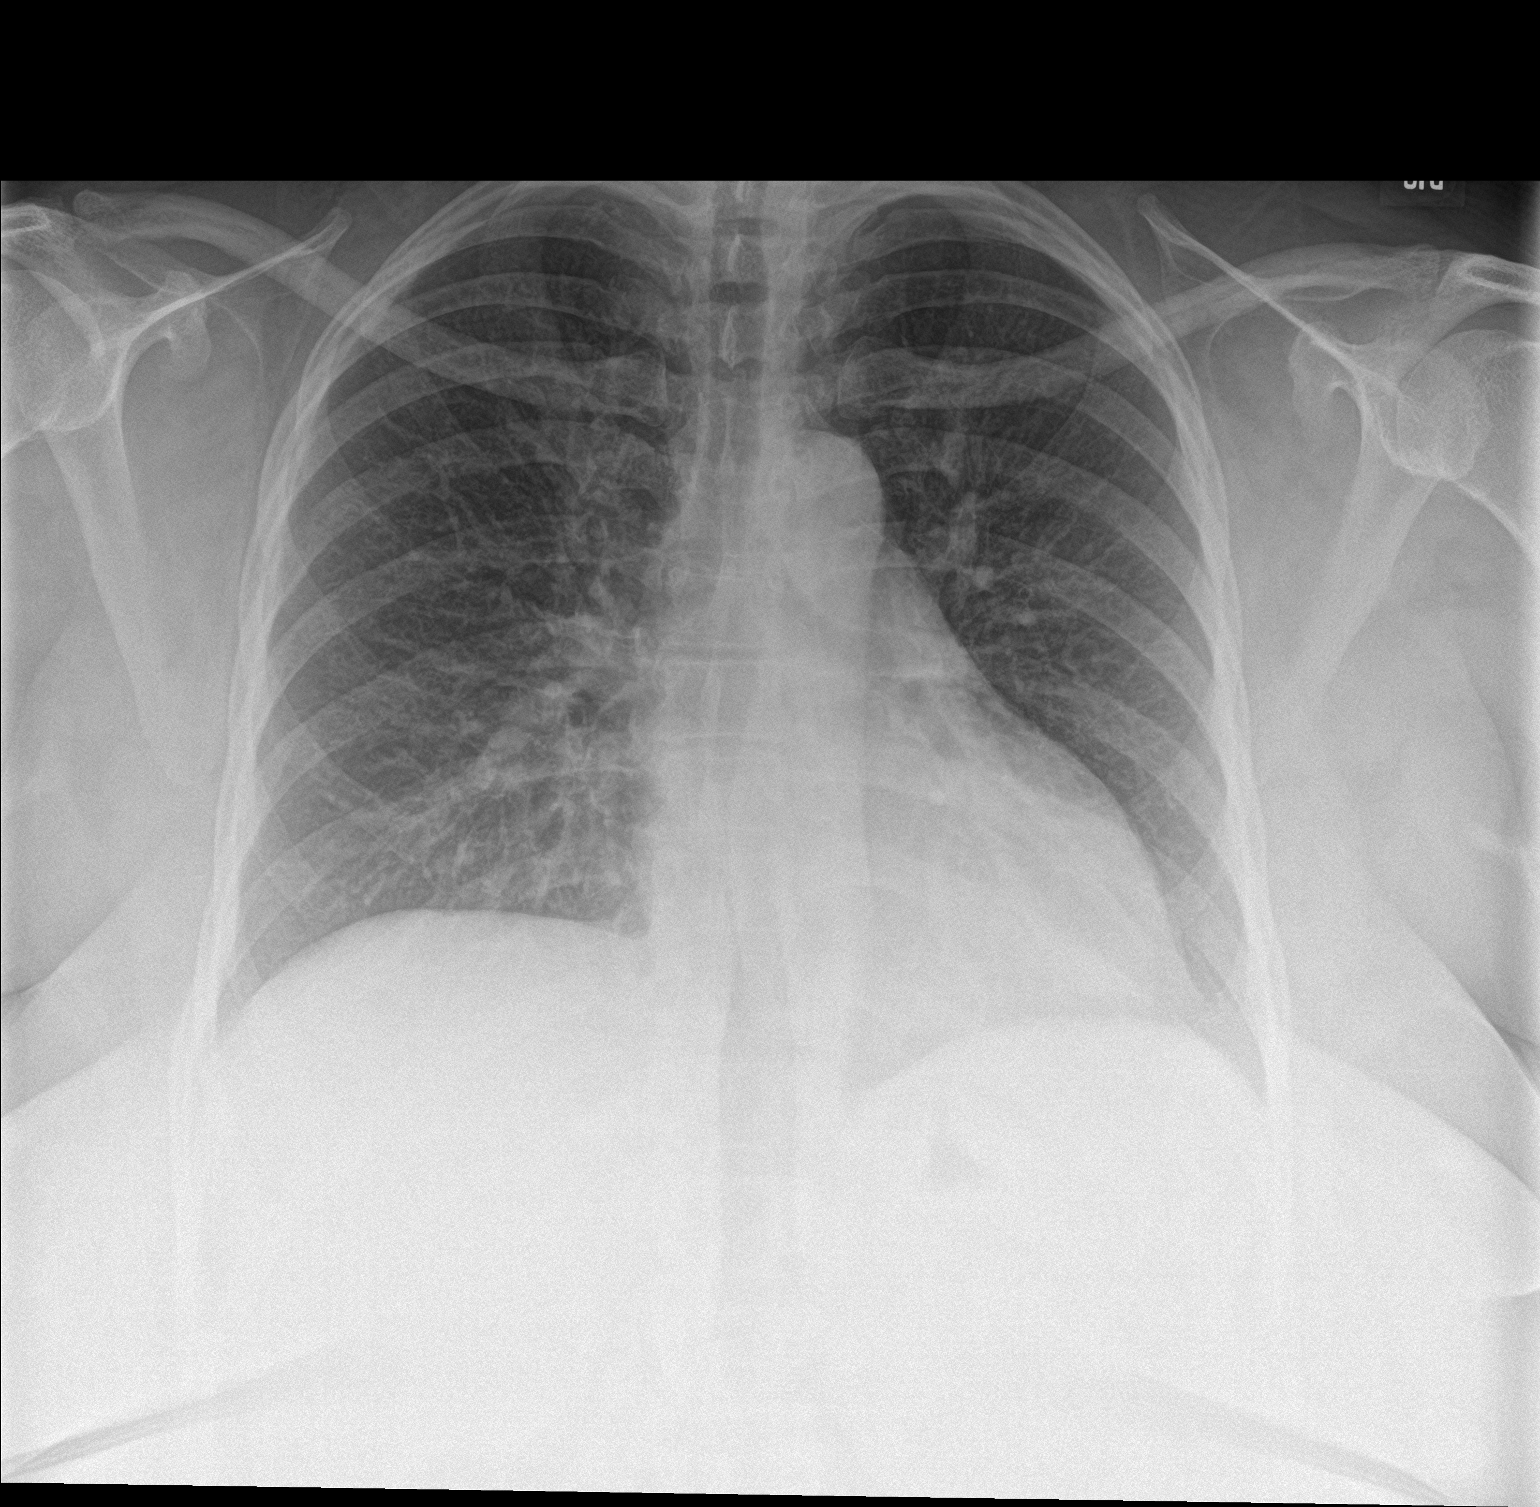

[chest lat]
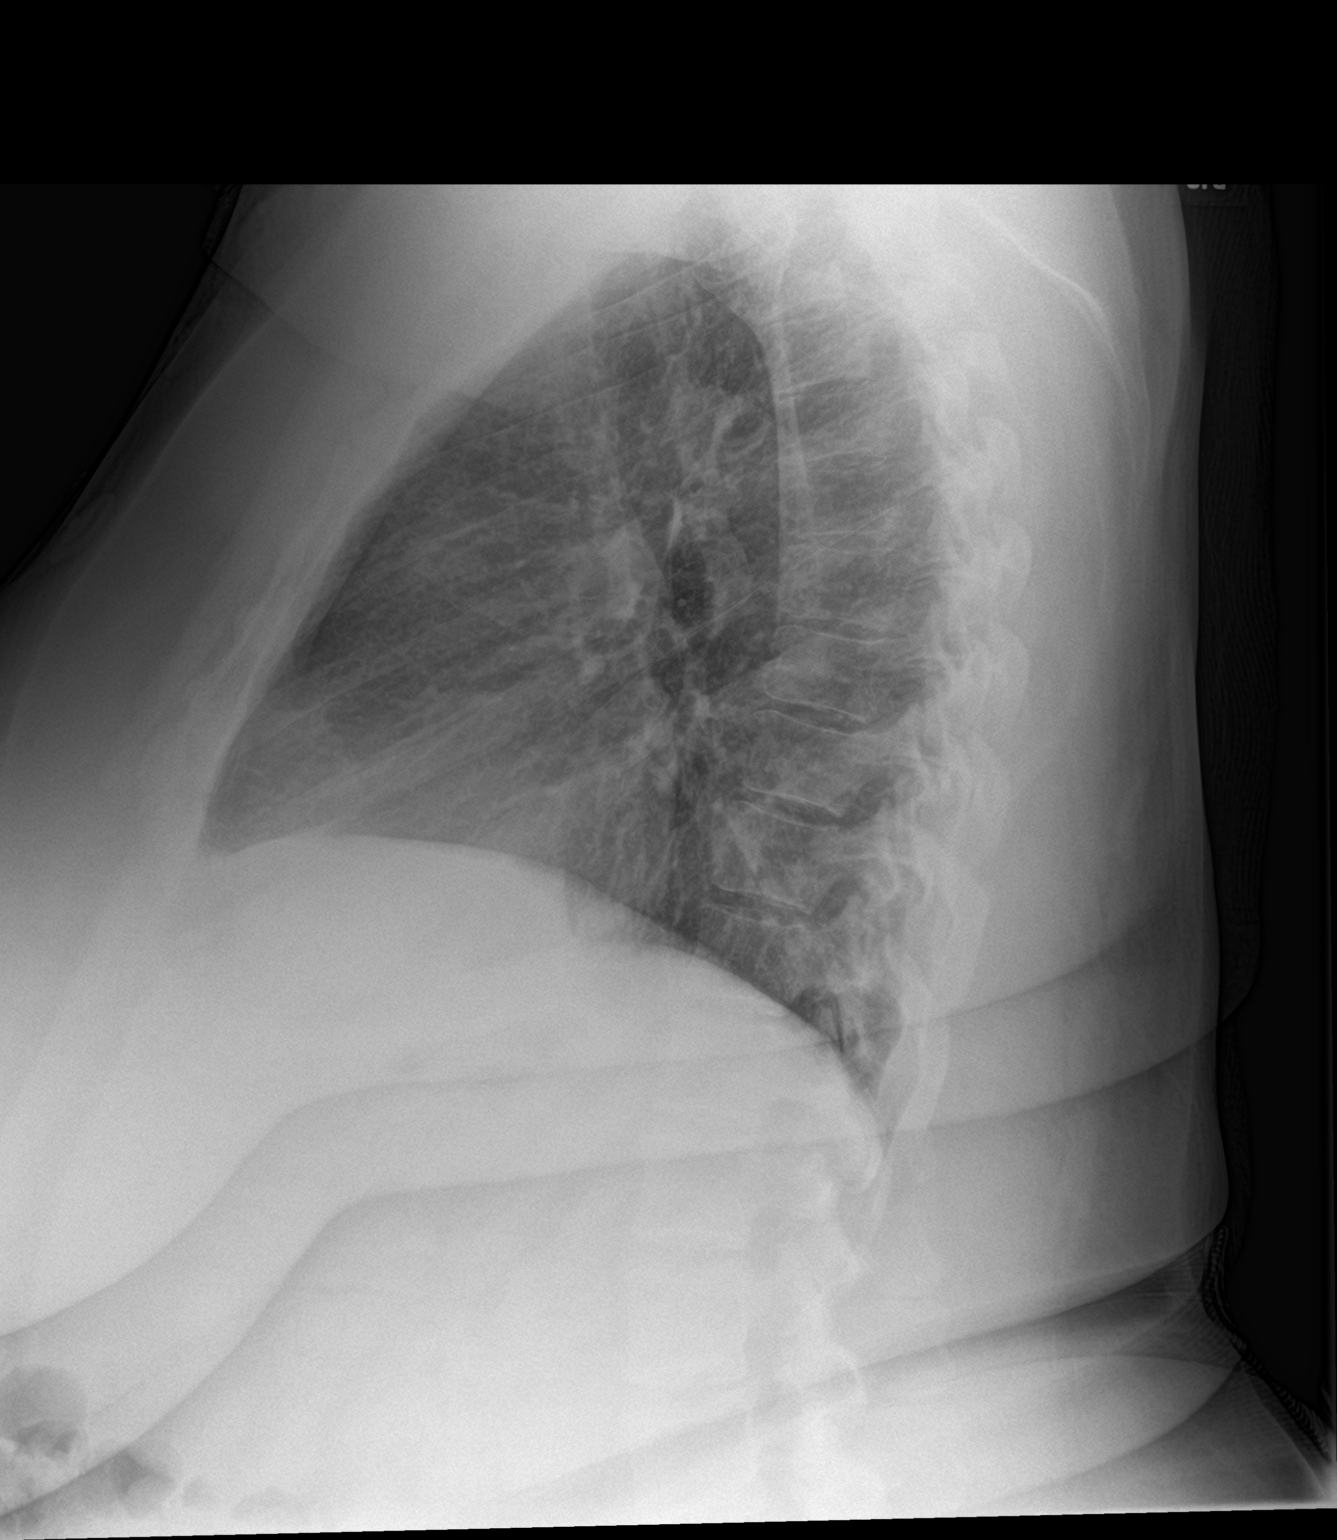

[2 of 2 positions shown; findings below may reference images not displayed]

FINDINGS: No focal consolidation, pleural effusion or pneumothorax. The
cardiac silhouette is within limits. No acute osseous pathology.
IMPRESSION: No active cardiopulmonary disease.
